# Patient Record
Sex: Male | Born: 1960 | Race: Black or African American | Hispanic: No | Marital: Single | Smoking: Former smoker
Health system: Southern US, Community
[De-identification: ages and names within clinical notes are randomized; demographics above are authoritative.]

---

## 2017-10-06 ENCOUNTER — Inpatient Hospital Stay (HOSPITAL_COMMUNITY)
Admission: EM | Admit: 2017-10-06 | Discharge: 2017-10-09 | DRG: 071 | Disposition: A | Discharge status: Home / Self Care | Payer: Medicaid Other | Attending: Critical Care Medicine | Admitting: Critical Care Medicine

## 2017-10-06 ENCOUNTER — Encounter (HOSPITAL_COMMUNITY): Payer: Self-pay | Admitting: Oncology

## 2017-10-06 ENCOUNTER — Emergency Department (HOSPITAL_COMMUNITY): Payer: Medicaid Other

## 2017-10-06 DIAGNOSIS — R0689 Other abnormalities of breathing: Secondary | ICD-10-CM | POA: Diagnosis present

## 2017-10-06 DIAGNOSIS — E669 Obesity, unspecified: Secondary | ICD-10-CM | POA: Diagnosis present

## 2017-10-06 DIAGNOSIS — M109 Gout, unspecified: Secondary | ICD-10-CM | POA: Diagnosis present

## 2017-10-06 DIAGNOSIS — R4701 Aphasia: Secondary | ICD-10-CM | POA: Diagnosis not present

## 2017-10-06 DIAGNOSIS — I6330 Cerebral infarction due to thrombosis of unspecified cerebral artery: Secondary | ICD-10-CM

## 2017-10-06 DIAGNOSIS — I6522 Occlusion and stenosis of left carotid artery: Secondary | ICD-10-CM | POA: Diagnosis present

## 2017-10-06 DIAGNOSIS — Z6841 Body Mass Index (BMI) 40.0 and over, adult: Secondary | ICD-10-CM

## 2017-10-06 DIAGNOSIS — G9340 Encephalopathy, unspecified: Secondary | ICD-10-CM | POA: Diagnosis present

## 2017-10-06 DIAGNOSIS — I6340 Cerebral infarction due to embolism of unspecified cerebral artery: Secondary | ICD-10-CM

## 2017-10-06 DIAGNOSIS — I10 Essential (primary) hypertension: Secondary | ICD-10-CM | POA: Diagnosis present

## 2017-10-06 DIAGNOSIS — G9341 Metabolic encephalopathy: Principal | ICD-10-CM | POA: Diagnosis present

## 2017-10-06 DIAGNOSIS — E860 Dehydration: Secondary | ICD-10-CM | POA: Diagnosis present

## 2017-10-06 LAB — COMPREHENSIVE METABOLIC PANEL
ALBUMIN: 4.3 g/dL (ref 3.5–5.0)
ALT: 91 U/L — ABNORMAL HIGH (ref 17–63)
ANION GAP: 11 (ref 5–15)
AST: 75 U/L — ABNORMAL HIGH (ref 15–41)
Alkaline Phosphatase: 60 U/L (ref 38–126)
BILIRUBIN TOTAL: 1.3 mg/dL — AB (ref 0.3–1.2)
BUN: 11 mg/dL (ref 6–20)
CO2: 24 mmol/L (ref 22–32)
Calcium: 9.8 mg/dL (ref 8.9–10.3)
Chloride: 100 mmol/L — ABNORMAL LOW (ref 101–111)
Creatinine, Ser: 0.93 mg/dL (ref 0.61–1.24)
GFR calc Af Amer: >60 mL/min (ref >60)
GFR calc non Af Amer: >60 mL/min (ref >60)
GLUCOSE: 121 mg/dL — AB (ref 65–99)
POTASSIUM: 4.1 mmol/L (ref 3.5–5.1)
SODIUM: 135 mmol/L (ref 135–145)
Total Protein: 8.5 g/dL — ABNORMAL HIGH (ref 6.5–8.1)

## 2017-10-06 LAB — CBC WITH DIFFERENTIAL/PLATELET
BASOS ABS: 0 10*3/uL (ref 0.0–0.1)
Basophils Relative: 0 %
EOS ABS: 0 10*3/uL (ref 0.0–0.7)
Eosinophils Relative: 0 %
HCT: 41.4 % (ref 39.0–52.0)
HEMOGLOBIN: 14.1 g/dL (ref 13.0–17.0)
LYMPHS ABS: 0.5 10*3/uL — AB (ref 0.7–4.0)
Lymphocytes Relative: 12 %
MCH: 34.8 pg — AB (ref 26.0–34.0)
MCHC: 34.1 g/dL (ref 30.0–36.0)
MCV: 102.2 fL — ABNORMAL HIGH (ref 78.0–100.0)
Monocytes Absolute: 0.2 10*3/uL (ref 0.1–1.0)
Monocytes Relative: 5 %
NEUTROS PCT: 83 %
Neutro Abs: 3.6 10*3/uL (ref 1.7–7.7)
Platelets: 198 10*3/uL (ref 150–400)
RBC: 4.05 MIL/uL — AB (ref 4.22–5.81)
RDW: 12.3 % (ref 11.5–15.5)
WBC: 4.3 10*3/uL (ref 4.0–10.5)

## 2017-10-06 LAB — ETHANOL: Alcohol, Ethyl (B): <10 mg/dL (ref <10)

## 2017-10-06 LAB — LIPASE, BLOOD: LIPASE: 37 U/L (ref 11–51)

## 2017-10-06 MED ORDER — SODIUM CHLORIDE 0.9 % IV SOLN
100.0000 mL/h | INTRAVENOUS | Status: DC
  Administered 2017-10-07 – 2017-10-09 (×6): 100 mL/h via INTRAVENOUS

## 2017-10-06 MED ORDER — ZIPRASIDONE MESYLATE 20 MG IM SOLR
20.0000 mg | Freq: Once | INTRAMUSCULAR | Status: DC
  Filled 2017-10-06: qty 20

## 2017-10-06 MED ORDER — LORAZEPAM 2 MG/ML IJ SOLN
2.0000 mg | INTRAMUSCULAR | Status: AC
  Administered 2017-10-07: 2 mg via INTRAVENOUS
  Filled 2017-10-06: qty 1

## 2017-10-06 MED ORDER — IOPAMIDOL (ISOVUE-370) INJECTION 76%
100.0000 mL | Freq: Once | INTRAVENOUS | Status: AC | PRN
  Administered 2017-10-07: 100 mL via INTRAVENOUS

## 2017-10-06 MED ORDER — SODIUM CHLORIDE 0.9 % IV BOLUS
1000.0000 mL | Freq: Once | INTRAVENOUS | Status: AC
  Administered 2017-10-06: 1000 mL via INTRAVENOUS

## 2017-10-06 MED ORDER — LORAZEPAM 2 MG/ML IJ SOLN
INTRAMUSCULAR | Status: AC
Start: 2017-10-06 — End: 2017-10-06
  Administered 2017-10-06: 2 mg
  Filled 2017-10-06: qty 1

## 2017-10-06 MED ORDER — SODIUM CHLORIDE 0.9 % IV BOLUS
500.0000 mL | Freq: Once | INTRAVENOUS | Status: AC
  Administered 2017-10-07: 500 mL via INTRAVENOUS

## 2017-10-06 MED ORDER — HALOPERIDOL LACTATE 5 MG/ML IJ SOLN
INTRAMUSCULAR | Status: AC
  Filled 2017-10-06: qty 1

## 2017-10-06 NOTE — Consult Note (Addendum)
NEURO HOSPITALIST CONSULT NOTE   Requestig physician: Dr. Jeraldine Loots  Reason for Consult: Aphasia  History obtained from:  Patient and EMS  HPI:                                                                                                                                          Phillip Rivas is an 57 y.o. male who was brought in by EMS from the [facility] strip, where he was noted to become acutely altered with stumbling and vomiting. He had been in the sun all day, so sunstroke was a consideration. He was not answering questions appropriately for EMS and was also dysarthric. Stroke was suspected so he was given 324 mg ASA en route. He denied chest pain. Skin was somewhat warm to touch on arrival. There is no information on file for this patient in Epic and he is unable to relate his PMHx.   No past medical history on file.  No family history on file.            Social History:  reports that he drinks alcohol. His tobacco and drug histories are not on file.  Allergies not on file  MEDICATIONS:                                                                                                                     Unknown  ROS:                                                                                                                                       Unable to obtain due to dysphasia/confusion.  Blood pressure (!) 144/90, pulse 82, temperature 99 F (37.2 C),  temperature source Oral, resp. rate (!) 25, SpO2 93 %.   General Examination:                                                                                                      HEENT-  /AT   Lungs- Respirations unlabored Abdomen- Obese Extremities- Warm and well perfused   Neurological Examination Mental Status: Alert. Unable to provide answers to orientation questions. No dysarthria. Follows some commands but does not comprehend most and requires pantomiming to follow. Unable to provide  coherent answers to questions. Speech output with waxing and waning fluency alternating with word salad. Cranial Nerves: II: Blinks to threat bilaterally. PERRL.  III,IV, VI: Ptosis not present. EOMI without nystagmus. No forced gaze deviation.  V,VII: Smile symmetric, reacts to tactile stimulation bilaterally VIII: hearing intact to voice IX,X: No hoarseness or hypophonia XI: Head at midline XII: Does not protrude tongue to command Motor: Right : Upper extremity   5/5    Left:     Upper extremity   5/5  Lower extremity   5/5     Lower extremity   5/5 Strength measured both by following of commands and resistance when patient spontaneously moves limbs. No twitching, jerking or other adventitious movements noted.  Sensory: Moves each limb in response to "move what I am touching"  Deep Tendon Reflexes: 1+ and symmetric throughout, except for 0 achilles reflexes Plantars: Right: downgoing  Left: downgoing Cerebellar: Slow FNF bilaterally without ataxia Gait: Deferred due to falls risk concerns   Lab Results: Basic Metabolic Panel: Recent Labs  Lab 10/06/17 1945  NA 135  K 4.1  CL 100*  CO2 24  GLUCOSE 121*  BUN 11  CREATININE 0.93  CALCIUM 9.8    CBC: Recent Labs  Lab 10/06/17 1945  WBC 4.3  NEUTROABS 3.6  HGB 14.1  HCT 41.4  MCV 102.2*  PLT 198    Cardiac Enzymes: No results for input(s): CKTOTAL, CKMB, CKMBINDEX, TROPONINI in the last 168 hours.  Lipid Panel: No results for input(s): CHOL, TRIG, HDL, CHOLHDL, VLDL, LDLCALC in the last 168 hours.  Imaging: No results found.  Assessment: 57 year old male with AMS/dysphasia 1. Exam with fluent speech alternating with word salad and comprehension deficit. No focal weakness, facial droop or ataxia noted. DDx includes left perisylvian stroke/TIA versus heat stroke versus toxic/metabolic encephalopathy.  2. STAT CT head without hemorrhage 3. STAT CTA/CTP pending due to severe agitation 4. Unknown  PMHx  Recommendations: 1. Obtain CTA/CTP when agitation resolves with medication. Has received Ativan, Haldol and Geodon. Continues to be severely agitated. Continue Code Stroke status.  2. Frequent neuro checks.  3. Toxic metabolic work up.   50 minutes spent in the emergent neurological evaluation and management of this critically ill patient.  Addendum, CTA/CTP completed: 1. Negative CTA for emergent large vessel occlusion. No acute infarct by CT perfusion. 2. 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 Left MCA branches are attenuated as compared  to the contralateral right. 3. Delayed perfusion/perfusion mismatch within the left cerebral hemisphere, overall watershed in distribution, likely secondary to the above-mentioned stenoses and atheromatous disease. 4. No other high-grade or correctable stenosis identified within the major arterial vasculature of the head and neck.  Additional recommendations: -Load ASA 650 mg x 1 now via NGT or 600 mg rectally -Full stroke work up  Electronically signed: Dr. Caryl Pina 10/06/2017, 11:15 PM

## 2017-10-06 NOTE — ED Triage Notes (Signed)
Pt bib [facility] from [facility] strip. Pt had been in the sun all day. Per EMS, bystanders saw pt stumbling(pt does use a cane to ambulate) as well as vomiting.  Pt not answering questions appropriately for EMS.  Pt given 324 mg ASA en route as pt stated he had indigestion but could not elaborate.  Pt denies CP at this time.

## 2017-10-06 NOTE — ED Notes (Signed)
Patient transported to CT 

## 2017-10-06 NOTE — ED Provider Notes (Signed)
[facility] EMERGENCY DEPARTMENT Provider Note   CSN: [id number] Arrival date & time: 10/06/17  1935     History   Chief Complaint Chief Complaint  Patient presents with  . Altered Mental Status    HPI Phillip Rivas is a 57 y.o. male.  HPI Patient presents with confusion. Onset seems to have been earlier this afternoon, and the patient does recall being [place], but patient is confused, cannot provide details of his condition, or his name. However, the patient is oriented to place, roughly. Patient denies pain, states that he may have had a headache, and was nauseous, vomiting, almost passed out prior to EMS arrival. He does acknowledge drinking alcohol earlier today. He states that he is generally well, and was well prior to the episode. No past medical history on file. Patient unable to provide any details of history     Home Medications    Patient denies medications  Family History Patient cannot provide details of family history Social History Social History   Tobacco Use  . Smoking status: Unknown If Ever Smoked  Substance Use Topics  . Alcohol use: Yes  . Drug use: Not on file     Allergies   Patient has no allergy information on record.   Review of Systems Review of Systems  Constitutional:       Per HPI, otherwise negative  HENT:       Per HPI, otherwise negative  Respiratory:       Per HPI, otherwise negative  Cardiovascular:       Per HPI, otherwise negative  Gastrointestinal: Positive for nausea and vomiting. Negative for abdominal pain.  Endocrine:       Negative aside from HPI  Genitourinary:       Neg aside from HPI   Musculoskeletal:       Per HPI, otherwise negative  Skin: Negative.   Neurological: Positive for headaches. Negative for syncope.  Psychiatric/Behavioral: Positive for confusion.     Physical Exam Updated Vital Signs BP (!) 155/100   Pulse 82   Temp 99 F (37.2 C) (Oral)   Resp 16    SpO2 99%   Physical Exam  Constitutional: He appears well-developed. No distress.  HENT:  Head: Normocephalic and atraumatic.  Eyes: Conjunctivae and EOM are normal.  Cardiovascular: Normal rate and regular rhythm.  Pulmonary/Chest: Effort normal. No stridor. No respiratory distress.  Abdominal: He exhibits no distension. There is no tenderness.  Musculoskeletal: He exhibits no edema.  Neurological: He is alert. He displays no atrophy and no tremor. He exhibits normal muscle tone. He displays no seizure activity.  Patient moves all extremity spontaneously, has equal strength in upper and lower extremities, has no tremor.  Skin: Skin is warm and dry.  Psychiatric: He has a normal mood and affect.  Slightly slowed, but interactive, awake and alert, confused.  Nursing note and vitals reviewed.    ED Treatments / Results  Labs (all labs ordered are listed, but only abnormal results are displayed) Labs Reviewed  COMPREHENSIVE METABOLIC PANEL - Abnormal; Notable for the following components:      Result Value   Chloride 100 (*)    Glucose, Bld 121 (*)    Total Protein 8.5 (*)    AST 75 (*)    ALT 91 (*)    Total Bilirubin 1.3 (*)    All other components within normal limits  CBC WITH DIFFERENTIAL/PLATELET - Abnormal; Notable for the following components:  RBC 4.05 (*)    MCV 102.2 (*)    MCH 34.8 (*)    Lymphs Abs 0.5 (*)    All other components within normal limits  ETHANOL  LIPASE, BLOOD  RAPID URINE DRUG SCREEN, HOSP PERFORMED  URINALYSIS, ROUTINE W REFLEX MICROSCOPIC  AMMONIA    EKG EKG Interpretation  Date/Time:  Saturday October 06 2017 21:17:51 EDT Ventricular Rate:  84 PR Interval:    QRS Duration: 99 QT Interval:  463 QTC Calculation: 548 R Axis:   13 Text Interpretation:  Sinus rhythm Ventricular premature complex Probable left atrial enlargement Left ventricular hypertrophy ST-t wave abnormality Baseline wander Abnormal ekg Confirmed by Gerhard Munch  [phone]) on 10/06/2017 10:46:29 PM   Radiology No results found.  Procedures Procedures (including critical care time)  Medications Ordered in ED Medications  sodium chloride 0.9 % bolus 500 mL (has no administration in time range)    Followed by  0.9 %  sodium chloride infusion (has no administration in time range)  sodium chloride 0.9 % bolus 1,000 mL (0 mLs Intravenous Stopped 10/06/17 2115)     Initial Impression / Assessment and Plan / ED Course  I have reviewed the triage vital signs and the nursing notes.  Pertinent labs & imaging results that were available during my care of the patient were reviewed by me and considered in my medical decision making (see chart for details).    10:47 PM On repeat exam the patient is making less sense verbally and then he was on arrival. Initial labs notable for minor hepatic dysfunction. Patient seemingly denies any chest or abdominal pain, but does have headache, possibly. He is following commands inconsistently, continues to have no focal asymmetry of motion, but has more unreliable neurologic exam, and with his worsening dysarthria, after discussion with our neurologist, the patient was designated as a code stroke.  12:08 AM In the CT scanner the patient became acutely agitated after being strapped down for perfusion studies. He required additional sedation with Ativan, Haldol, but was not tolerant of the study.  12:20 AM Patient intolerant of final CT scans, but initial CT stroke protocol did not demonstrate obvious stroke. Patient is now coming after multiple doses of Ativan. Patient really acknowledged some alcohol intake, but alcohol level here is 0. Some suspicion for withdrawal, though the patient's lack of tachycardia, and other obvious stigmata of withdrawal is not consistent with this. Given the persistent  dysarthria, delirium, he will require admission for further evaluation and management.   Final Clinical Impressions(s) /  ED Diagnoses  Delirium Dysarthria   Gerhard Munch, MD 10/07/17 0021

## 2017-10-07 ENCOUNTER — Inpatient Hospital Stay (HOSPITAL_COMMUNITY): Payer: Medicaid Other

## 2017-10-07 ENCOUNTER — Observation Stay (HOSPITAL_COMMUNITY): Payer: Medicaid Other

## 2017-10-07 ENCOUNTER — Other Ambulatory Visit: Payer: Self-pay

## 2017-10-07 DIAGNOSIS — R451 Restlessness and agitation: Secondary | ICD-10-CM | POA: Diagnosis not present

## 2017-10-07 DIAGNOSIS — I6522 Occlusion and stenosis of left carotid artery: Secondary | ICD-10-CM | POA: Diagnosis present

## 2017-10-07 DIAGNOSIS — G9340 Encephalopathy, unspecified: Secondary | ICD-10-CM | POA: Diagnosis present

## 2017-10-07 DIAGNOSIS — G9341 Metabolic encephalopathy: Secondary | ICD-10-CM | POA: Diagnosis present

## 2017-10-07 DIAGNOSIS — E669 Obesity, unspecified: Secondary | ICD-10-CM | POA: Diagnosis present

## 2017-10-07 DIAGNOSIS — I10 Essential (primary) hypertension: Secondary | ICD-10-CM | POA: Diagnosis present

## 2017-10-07 DIAGNOSIS — I6340 Cerebral infarction due to embolism of unspecified cerebral artery: Secondary | ICD-10-CM

## 2017-10-07 DIAGNOSIS — I6330 Cerebral infarction due to thrombosis of unspecified cerebral artery: Secondary | ICD-10-CM

## 2017-10-07 DIAGNOSIS — R0689 Other abnormalities of breathing: Secondary | ICD-10-CM | POA: Diagnosis present

## 2017-10-07 DIAGNOSIS — Z6841 Body Mass Index (BMI) 40.0 and over, adult: Secondary | ICD-10-CM | POA: Diagnosis not present

## 2017-10-07 DIAGNOSIS — J9600 Acute respiratory failure, unspecified whether with hypoxia or hypercapnia: Secondary | ICD-10-CM | POA: Diagnosis not present

## 2017-10-07 DIAGNOSIS — E860 Dehydration: Secondary | ICD-10-CM | POA: Diagnosis present

## 2017-10-07 DIAGNOSIS — M109 Gout, unspecified: Secondary | ICD-10-CM | POA: Diagnosis present

## 2017-10-07 LAB — COMPREHENSIVE METABOLIC PANEL
ALT: 74 U/L — ABNORMAL HIGH (ref 17–63)
ANION GAP: 9 (ref 5–15)
AST: 52 U/L — ABNORMAL HIGH (ref 15–41)
Albumin: 3.7 g/dL (ref 3.5–5.0)
Alkaline Phosphatase: 51 U/L (ref 38–126)
BUN: 10 mg/dL (ref 6–20)
CHLORIDE: 101 mmol/L (ref 101–111)
CO2: 25 mmol/L (ref 22–32)
CREATININE: 0.95 mg/dL (ref 0.61–1.24)
Calcium: 9.2 mg/dL (ref 8.9–10.3)
GFR calc Af Amer: >60 mL/min (ref >60)
Glucose, Bld: 128 mg/dL — ABNORMAL HIGH (ref 65–99)
POTASSIUM: 4.1 mmol/L (ref 3.5–5.1)
SODIUM: 135 mmol/L (ref 135–145)
Total Bilirubin: 1.2 mg/dL (ref 0.3–1.2)
Total Protein: 7.3 g/dL (ref 6.5–8.1)

## 2017-10-07 LAB — LIPID PANEL
Cholesterol: 184 mg/dL (ref 0–200)
HDL: 125 mg/dL (ref >40)
LDL CALC: 52 mg/dL (ref 0–99)
TRIGLYCERIDES: 34 mg/dL (ref <150)
Total CHOL/HDL Ratio: 1.5 RATIO
VLDL: 7 mg/dL (ref 0–40)

## 2017-10-07 LAB — I-STAT ARTERIAL BLOOD GAS, ED
ACID-BASE DEFICIT: 3 mmol/L — AB (ref 0.0–2.0)
BICARBONATE: 22.8 mmol/L (ref 20.0–28.0)
O2 Saturation: 100 %
PO2 ART: 303 mmHg — AB (ref 83.0–108.0)
TCO2: 24 mmol/L (ref 22–32)
pCO2 arterial: 42.6 mmHg (ref 32.0–48.0)
pH, Arterial: 7.337 — ABNORMAL LOW (ref 7.350–7.450)

## 2017-10-07 LAB — RAPID URINE DRUG SCREEN, HOSP PERFORMED
Amphetamines: NOT DETECTED
Barbiturates: NOT DETECTED
Benzodiazepines: NOT DETECTED
COCAINE: NOT DETECTED
Opiates: NOT DETECTED
Tetrahydrocannabinol: NOT DETECTED

## 2017-10-07 LAB — POCT I-STAT 3, ART BLOOD GAS (G3+)
Acid-base deficit: 3 mmol/L — ABNORMAL HIGH (ref 0.0–2.0)
BICARBONATE: 21.7 mmol/L (ref 20.0–28.0)
O2 SAT: 98 %
PCO2 ART: 35.8 mmHg (ref 32.0–48.0)
PH ART: 7.389 (ref 7.350–7.450)
PO2 ART: 107 mmHg (ref 83.0–108.0)
TCO2: 23 mmol/L (ref 22–32)

## 2017-10-07 LAB — CBC WITH DIFFERENTIAL/PLATELET
Basophils Absolute: 0 10*3/uL (ref 0.0–0.1)
Basophils Relative: 0 %
EOS ABS: 0 10*3/uL (ref 0.0–0.7)
EOS PCT: 0 %
HCT: 38.6 % — ABNORMAL LOW (ref 39.0–52.0)
Hemoglobin: 12.8 g/dL — ABNORMAL LOW (ref 13.0–17.0)
LYMPHS ABS: 0.5 10*3/uL — AB (ref 0.7–4.0)
LYMPHS PCT: 12 %
MCH: 34.1 pg — AB (ref 26.0–34.0)
MCHC: 33.2 g/dL (ref 30.0–36.0)
MCV: 102.9 fL — AB (ref 78.0–100.0)
MONO ABS: 0.5 10*3/uL (ref 0.1–1.0)
Monocytes Relative: 11 %
Neutro Abs: 3.5 10*3/uL (ref 1.7–7.7)
Neutrophils Relative %: 77 %
PLATELETS: 171 10*3/uL (ref 150–400)
RBC: 3.75 MIL/uL — ABNORMAL LOW (ref 4.22–5.81)
RDW: 12.4 % (ref 11.5–15.5)
WBC: 4.5 10*3/uL (ref 4.0–10.5)

## 2017-10-07 LAB — GLUCOSE, CAPILLARY
GLUCOSE-CAPILLARY: 82 mg/dL (ref 65–99)
Glucose-Capillary: 104 mg/dL — ABNORMAL HIGH (ref 65–99)
Glucose-Capillary: 111 mg/dL — ABNORMAL HIGH (ref 65–99)
Glucose-Capillary: 119 mg/dL — ABNORMAL HIGH (ref 65–99)
Glucose-Capillary: 123 mg/dL — ABNORMAL HIGH (ref 65–99)

## 2017-10-07 LAB — TROPONIN I: Troponin I: <0.03 ng/mL (ref <0.03)

## 2017-10-07 LAB — URINALYSIS, ROUTINE W REFLEX MICROSCOPIC
BILIRUBIN URINE: NEGATIVE
Bacteria, UA: NONE SEEN
GLUCOSE, UA: NEGATIVE mg/dL
KETONES UR: 20 mg/dL — AB
LEUKOCYTES UA: NEGATIVE
NITRITE: NEGATIVE
PH: 5 (ref 5.0–8.0)
Protein, ur: 30 mg/dL — AB
SPECIFIC GRAVITY, URINE: 1.024 (ref 1.005–1.030)

## 2017-10-07 LAB — MRSA PCR SCREENING: MRSA by PCR: NEGATIVE

## 2017-10-07 LAB — PROCALCITONIN: PROCALCITONIN: 0.1 ng/mL

## 2017-10-07 LAB — TSH: TSH: 1.468 u[IU]/mL (ref 0.350–4.500)

## 2017-10-07 LAB — AMMONIA: Ammonia: 20 umol/L (ref 9–35)

## 2017-10-07 LAB — SALICYLATE LEVEL: Salicylate Lvl: <7 mg/dL (ref 2.8–30.0)

## 2017-10-07 LAB — GAMMA GT: GGT: 77 U/L — AB (ref 7–50)

## 2017-10-07 LAB — PHOSPHORUS: PHOSPHORUS: 2.8 mg/dL (ref 2.5–4.6)

## 2017-10-07 LAB — MAGNESIUM: MAGNESIUM: 1.8 mg/dL (ref 1.7–2.4)

## 2017-10-07 LAB — BRAIN NATRIURETIC PEPTIDE: B Natriuretic Peptide: 259.3 pg/mL — ABNORMAL HIGH (ref 0.0–100.0)

## 2017-10-07 LAB — HIV ANTIBODY (ROUTINE TESTING W REFLEX): HIV SCREEN 4TH GENERATION: NONREACTIVE

## 2017-10-07 LAB — HEMOGLOBIN A1C
HEMOGLOBIN A1C: 4.2 % — AB (ref 4.8–5.6)
Mean Plasma Glucose: 73.84 mg/dL

## 2017-10-07 LAB — ACETAMINOPHEN LEVEL: Acetaminophen (Tylenol), Serum: <10 ug/mL — ABNORMAL LOW (ref 10–30)

## 2017-10-07 LAB — LACTIC ACID, PLASMA: Lactic Acid, Venous: 0.8 mmol/L (ref 0.5–1.9)

## 2017-10-07 LAB — CK: Total CK: 296 U/L (ref 49–397)

## 2017-10-07 LAB — TRIGLYCERIDES: Triglycerides: 52 mg/dL (ref <150)

## 2017-10-07 MED ORDER — MIDAZOLAM HCL 2 MG/2ML IJ SOLN
INTRAMUSCULAR | Status: AC
  Filled 2017-10-07: qty 2

## 2017-10-07 MED ORDER — LORAZEPAM 2 MG/ML IJ SOLN: 0.0000 mg | Freq: Four times a day (QID) | INTRAMUSCULAR | Status: DC

## 2017-10-07 MED ORDER — PROPOFOL 1000 MG/100ML IV EMUL
INTRAVENOUS | Status: AC
  Filled 2017-10-07: qty 100

## 2017-10-07 MED ORDER — ORAL CARE MOUTH RINSE: 15.0000 mL | OROMUCOSAL | Status: DC

## 2017-10-07 MED ORDER — MIDAZOLAM HCL 2 MG/2ML IJ SOLN
5.0000 mg | Freq: Once | INTRAMUSCULAR | Status: AC
  Administered 2017-10-07: 5 mg via INTRAVENOUS

## 2017-10-07 MED ORDER — FENTANYL 2500MCG IN NS 250ML (10MCG/ML) PREMIX INFUSION
0.0000 ug/h | INTRAVENOUS | Status: DC
  Administered 2017-10-07: 50 ug/h via INTRAVENOUS
  Filled 2017-10-07: qty 250

## 2017-10-07 MED ORDER — CHLORHEXIDINE GLUCONATE 0.12% ORAL RINSE (MEDLINE KIT)
15.0000 mL | Freq: Two times a day (BID) | OROMUCOSAL | Status: DC
  Administered 2017-10-07: 15 mL via OROMUCOSAL

## 2017-10-07 MED ORDER — PANTOPRAZOLE SODIUM 40 MG PO PACK
40.0000 mg | PACK | Freq: Every day | ORAL | Status: DC
  Filled 2017-10-07: qty 20

## 2017-10-07 MED ORDER — ETOMIDATE 2 MG/ML IV SOLN
INTRAVENOUS | Status: DC | PRN
Start: 2017-10-07 — End: 2017-10-07
  Administered 2017-10-07: 20 mg via INTRAVENOUS

## 2017-10-07 MED ORDER — MAGNESIUM SULFATE 2 GM/50ML IV SOLN
2.0000 g | Freq: Once | INTRAVENOUS | Status: AC
  Administered 2017-10-07: 2 g via INTRAVENOUS
  Filled 2017-10-07: qty 50

## 2017-10-07 MED ORDER — LORAZEPAM 2 MG/ML IJ SOLN
INTRAMUSCULAR | Status: AC
  Filled 2017-10-07: qty 1

## 2017-10-07 MED ORDER — ASPIRIN 300 MG RE SUPP
600.0000 mg | Freq: Once | RECTAL | Status: AC
  Administered 2017-10-07: 600 mg via RECTAL
  Filled 2017-10-07: qty 2

## 2017-10-07 MED ORDER — SUCCINYLCHOLINE CHLORIDE 20 MG/ML IJ SOLN
INTRAMUSCULAR | Status: DC | PRN
  Administered 2017-10-07: 125 mg via INTRAVENOUS

## 2017-10-07 MED ORDER — VITAMIN B-1 100 MG PO TABS: 100.0000 mg | ORAL_TABLET | Freq: Every day | ORAL | Status: DC

## 2017-10-07 MED ORDER — STROKE: EARLY STAGES OF RECOVERY BOOK
Freq: Once | Status: DC
  Filled 2017-10-07: qty 1

## 2017-10-07 MED ORDER — SODIUM CHLORIDE 0.9 % IV SOLN: 250.0000 mL | INTRAVENOUS | Status: DC | PRN

## 2017-10-07 MED ORDER — LORAZEPAM 2 MG/ML IJ SOLN: 0.0000 mg | Freq: Two times a day (BID) | INTRAMUSCULAR | Status: DC

## 2017-10-07 MED ORDER — PROPOFOL 1000 MG/100ML IV EMUL
5.0000 ug/kg/min | Freq: Once | INTRAVENOUS | Status: DC
  Administered 2017-10-07: 20 ug/kg/min via INTRAVENOUS

## 2017-10-07 MED ORDER — LORAZEPAM 1 MG PO TABS: 0.0000 mg | ORAL_TABLET | Freq: Two times a day (BID) | ORAL | Status: DC

## 2017-10-07 MED ORDER — FENTANYL CITRATE (PF) 100 MCG/2ML IJ SOLN
INTRAMUSCULAR | Status: AC
  Filled 2017-10-07: qty 2

## 2017-10-07 MED ORDER — MIDAZOLAM HCL 2 MG/2ML IJ SOLN
INTRAMUSCULAR | Status: AC
  Filled 2017-10-07: qty 6

## 2017-10-07 MED ORDER — SENNOSIDES-DOCUSATE SODIUM 8.6-50 MG PO TABS: 1.0000 | ORAL_TABLET | Freq: Every evening | ORAL | Status: DC | PRN

## 2017-10-07 MED ORDER — MIDAZOLAM HCL 2 MG/2ML IJ SOLN
INTRAMUSCULAR | Status: AC
  Filled 2017-10-07: qty 4

## 2017-10-07 MED ORDER — PROPOFOL 1000 MG/100ML IV EMUL
0.0000 ug/kg/min | INTRAVENOUS | Status: DC
  Administered 2017-10-07: 50 ug/kg/min via INTRAVENOUS
  Filled 2017-10-07: qty 100

## 2017-10-07 MED ORDER — ASPIRIN EC 81 MG PO TBEC
81.0000 mg | DELAYED_RELEASE_TABLET | Freq: Every day | ORAL | Status: DC
  Administered 2017-10-08 – 2017-10-09 (×2): 81 mg via ORAL
  Filled 2017-10-07 (×2): qty 1

## 2017-10-07 MED ORDER — CHLORHEXIDINE GLUCONATE 0.12 % MT SOLN
15.0000 mL | Freq: Two times a day (BID) | OROMUCOSAL | Status: DC
  Administered 2017-10-07 – 2017-10-09 (×4): 15 mL via OROMUCOSAL
  Filled 2017-10-07: qty 15

## 2017-10-07 MED ORDER — THIAMINE HCL 100 MG/ML IJ SOLN
100.0000 mg | Freq: Every day | INTRAMUSCULAR | Status: DC
  Administered 2017-10-07 – 2017-10-09 (×3): 100 mg via INTRAVENOUS
  Filled 2017-10-07 (×3): qty 2

## 2017-10-07 MED ORDER — ATORVASTATIN CALCIUM 80 MG PO TABS
80.0000 mg | ORAL_TABLET | Freq: Every day | ORAL | Status: DC
  Administered 2017-10-08: 80 mg
  Filled 2017-10-07: qty 1

## 2017-10-07 MED ORDER — ENOXAPARIN SODIUM 40 MG/0.4ML ~~LOC~~ SOLN
40.0000 mg | Freq: Every day | SUBCUTANEOUS | Status: DC
  Administered 2017-10-07 – 2017-10-09 (×3): 40 mg via SUBCUTANEOUS
  Filled 2017-10-07 (×3): qty 0.4

## 2017-10-07 MED ORDER — THIAMINE HCL 100 MG/ML IJ SOLN
500.0000 mg | Freq: Every day | INTRAVENOUS | Status: DC
  Filled 2017-10-07: qty 5

## 2017-10-07 MED ORDER — LORAZEPAM 1 MG PO TABS: 0.0000 mg | ORAL_TABLET | Freq: Four times a day (QID) | ORAL | Status: DC

## 2017-10-07 MED ORDER — DEXMEDETOMIDINE HCL IN NACL 200 MCG/50ML IV SOLN
0.4000 ug/kg/h | INTRAVENOUS | Status: DC
Start: 2017-10-07 — End: 2017-10-08
  Administered 2017-10-07 (×4): 0.4 ug/kg/h via INTRAVENOUS
  Administered 2017-10-07: 0.5 ug/kg/h via INTRAVENOUS
  Administered 2017-10-08: 0.2 ug/kg/h via INTRAVENOUS
  Filled 2017-10-07 (×2): qty 50
  Filled 2017-10-07: qty 100
  Filled 2017-10-07 (×2): qty 50

## 2017-10-07 MED ORDER — THIAMINE HCL 100 MG/ML IJ SOLN: 100.0000 mg | Freq: Every day | INTRAMUSCULAR | Status: DC

## 2017-10-07 MED ORDER — ORAL CARE MOUTH RINSE
15.0000 mL | Freq: Two times a day (BID) | OROMUCOSAL | Status: DC
  Administered 2017-10-07 – 2017-10-08 (×3): 15 mL via OROMUCOSAL

## 2017-10-07 NOTE — Progress Notes (Signed)
 of fentanyl wasted in sink with Elayne Snare. RN

## 2017-10-07 NOTE — Procedures (Signed)
Extubation Procedure Note  Patient Details:   Name: Phillip Rivas DOB: 05-28-1961 MRN: [id number]   Airway Documentation:    Vent end date: 10/07/17 Vent end time: 0901   Evaluation  O2 sats: stable throughout Complications: No apparent complications Patient did tolerate procedure well. Bilateral Breath Sounds: Clear, Diminished   Yes   Pt extubated to 3L [place] per MD order. Pt stable throughout with no complications. VS within normal limits. Pt with positive cuff prior to extubation. Pt able to speak/mumble post extubation but unable/unwilling to cough. No stridor noted. RT will continue to closely monitor pt  Carolan Shiver 10/07/2017, 9:02 AM

## 2017-10-07 NOTE — Progress Notes (Signed)
PULMONARY / CRITICAL CARE MEDICINE   Name: Phillip Rivas MRN: [id number] DOB: 1960-08-29    ADMISSION DATE:  10/06/2017    CHIEF COMPLAINT: Altered mental status  HISTORY OF PRESENT ILLNESS:        This is a 57 year old who [place].  He is brought to the department emergency medicine for altered mental status where he required intubation mechanical ventilation to allow diagnostic studies.  Tox screen was negative.  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  He required 6 mg of Ativan in the wee hours of the morning to facilitate his CT scan.  When I initially examined him on propofol this morning he was unresponsive I held his propofol and he eventually began moving all 4 spontaneously but did not open eyes to noxious stimuli or voice.  He remains intubated and mechanically ventilated.  PAST MEDICAL HISTORY :  He  has no past medical history on file.  PAST SURGICAL HISTORY: He  has no past surgical history on file.  Not on File  No current facility-administered medications on file prior to encounter.    No current outpatient medications on file prior to encounter.    FAMILY HISTORY:  His has no family status information on file.    SOCIAL HISTORY: He  reports that he drinks alcohol.  REVIEW OF SYSTEMS:   Unobtainable.  His past medical history is unknown  SUBJECTIVE:  Unobtainable  VITAL SIGNS: BP 105/78   Pulse 62   Temp 99 F (37.2 C) (Oral)   Resp 20   Ht  (1.88 m)   Wt (!) 330 lb 11 oz (150 kg)   SpO2 99%   BMI 42.46 kg/m   HEMODYNAMICS:    VENTILATOR SETTINGS: Vent Mode: PRVC FiO2 (%):  [100 %] 100 % Set Rate:  [20 bmp] 20 bmp Vt Set:  [540 mL] 540 mL PEEP:  [5 cmH20] 5 cmH20 Plateau Pressure:  [25 cmH20] 25 cmH20  INTAKE / OUTPUT: I/O last 3 completed shifts: In: 1500  [IV Piggyback:1500] Out: -   PHYSICAL EXAMINATION: General: Somewhat obese middle-aged male who is orally intubated mechanically ventilated Neuro: He is not responsive to voice with his propofol held.  He does move all fours, he does not eye open.  Pupils are equal. Cardiovascular: S1 and S2 are regular without murmur rub or gallop.  He does not have dependent edema. Lungs: Respirations are unlabored, there is symmetric air movement, no wheezes Abdomen: The abdomen is soft without any organomegaly masses or tenderness   LABS:  BMET Recent Labs  Lab 10/06/17 1945 10/07/17 0202  NA 135 135  K 4.1 4.1  CL 100* 101  CO2 24 25  BUN 11 10  CREATININE 0.93 0.95  GLUCOSE 121* 128*    Electrolytes Recent Labs  Lab 10/06/17 1945 10/07/17 0202 10/07/17 0404  CALCIUM 9.8 9.2  --   MG  --   --  1.8  PHOS  --   --  2.8    CBC Recent Labs  Lab 10/06/17 1945 10/07/17 0202  WBC 4.3 4.5  HGB 14.1 12.8*  HCT 41.4 38.6*  PLT 198 171    Coag's No results for input(s): APTT, INR in the last 168 hours.  Sepsis Markers Recent Labs  Lab 10/07/17 0356  LATICACIDVEN 0.8  PROCALCITON 0.10  ABG Recent Labs  Lab 10/07/17 0350  PHART 7.337*  PCO2ART 42.6  PO2ART 303.0*    Liver Enzymes Recent Labs  Lab 10/06/17 1945 10/07/17 0202  AST 75* 52*  ALT 91* 74*  ALKPHOS 60 51  BILITOT 1.3* 1.2  ALBUMIN 4.3 3.7    Cardiac Enzymes Recent Labs  Lab 10/07/17 0404  TROPONINI <0.03    Glucose No results for input(s): GLUCAP in the last 168 hours.  Imaging Ct Angio Head W Or Wo Contrast  Result Date: 10/07/2017 CLINICAL DATA:  Initial evaluation for acute expressive aphasia. EXAM: CT ANGIOGRAPHY HEAD AND NECK CT PERFUSION BRAIN TECHNIQUE: Multidetector CT imaging of the head and neck was performed using the standard protocol during bolus administration of intravenous contrast. Multiplanar CT image reconstructions and MIPs were obtained to evaluate the vascular  anatomy. Carotid stenosis measurements (when applicable) are obtained utilizing NASCET criteria, using the distal internal carotid diameter as the denominator. Multiphase CT imaging of the brain was performed following IV bolus contrast injection. Subsequent parametric perfusion maps were calculated using RAPID software. CONTRAST:  ISOVUE-370 IOPAMIDOL (ISOVUE-370) INJECTION 76% COMPARISON:  Prior CT from 10/06/2017. FINDINGS: CTA NECK FINDINGS Aortic arch: Visualized aortic arch of normal caliber with normal 3 vessel morphology. No flow-limiting stenosis about the origin of the great vessels. Partially visualized subclavian arteries widely patent. Right carotid system: Right common and internal carotid arteries are patent without stenosis, dissection, or occlusion. No significant atheromatous narrowing about the right carotid bifurcation. Left carotid system: Left common and internal carotid arteries are patent without stenosis, dissection, or occlusion. No significant atheromatous narrowing about the left carotid bifurcation. Vertebral arteries: Both of the vertebral arteries arise from the subclavian arteries. Vertebral arteries poorly evaluated proximally due to extensive adjacent venous collateralization. Vertebral arteries are grossly patent to the skull base without obvious stenosis. Left vertebral artery slightly dominant. Skeleton: No acute osseus abnormality. No worrisome lytic or blastic osseous lesions. Mild multilevel degenerate spondylolysis noted within cervical spine. Other neck: No acute soft tissue abnormality within the neck. Endotracheal and enteric tubes in place. Upper chest: Visualized upper mediastinum within normal limits. Dependent atelectatic changes noted within the visualized lungs. Review of the MIP images confirms the above findings CTA HEAD FINDINGS Anterior circulation: Petrous, cavernous, and supraclinoid right ICA patent without flow-limiting stenosis. Right ICA terminus  widely patent. Right M1 segment well perfused without stenosis. No proximal right M2 occlusion. Distal right MCA branches well perfused. Petrous and cavernous left ICA widely patent. There is a focal moderate stenosis at the supraclinoid left ICA (series 8, image 118). Left ICA terminus patent distally. Asymmetric atheromatous irregularity throughout the left M1 segment with mild to moderate multifocal narrowing. No M1 occlusion. Additional focal severe proximal left M2 stenosis (series 10, image 99). No proximal M2 occlusion. Distal left MCA branches perfused with demonstrate asymmetric atheromatous irregularity as compared to the contralateral right MCA. Overall, left MCA branches are attenuated as compared to the contralateral right. A1 segments patent bilaterally. Patent and normal anterior communicating artery. Anterior cerebral artery is perfused to their distal aspects without high-grade stenosis. Posterior circulation: Dominant left vertebral artery patent to the vertebrobasilar junction without flow-limiting stenosis. Slightly diminutive right vertebral artery demonstrates scattered atheromatous irregularity without high-grade stenosis. Posterior inferior cerebral arteries patent bilaterally. Basilar patent to its distal aspect without stenosis. Superior cerebral arteries patent bilaterally. PCAs primarily supplied via the basilar and are patent to their distal aspects. Venous sinuses: Not well evaluated due to timing of the contrast  bolus. Anatomic variants: None significant.  No appreciable aneurysm. Delayed phase: Not performed. Review of the MIP images confirms the above findings CT Brain Perfusion Findings: CBF (<30%) Volume: 0mL Perfusion (Tmax>6.0s) volume: 58mL Mismatch Volume: 58mL Infarction Location:No frank infarct by CT perfusion. There is delayed perfusion with perfusion mismatch involving the left cerebral hemisphere, primarily involving the posterior left MCA territory and/or MCA-PCA  watershed territory, with additional perfusion abnormality within the deep white matter of the left cerebral hemisphere, left MCA watershed area. Findings suspected to be related to left supraclinoid stenosis with asymmetric atheromatous irregularity within the left MCA distribution. IMPRESSION: 1. Negative CTA for emergent large vessel occlusion. No acute infarct by CT perfusion. 2. 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 Left MCA branches are attenuated as compared to the contralateral right. 3. Delayed perfusion/perfusion mismatch within the left cerebral hemisphere, overall watershed in distribution, likely secondary to the above-mentioned stenoses and atheromatous disease. 4. No other high-grade or correctable stenosis identified within the major arterial vasculature of the head and neck. Critical Value/emergent results were called by telephone at the time of interpretation on 10/07/2017 at 5:14 am to Dr. Otelia Limes, who verbally acknowledged these results. Electronically Signed   By: Rise Mu M.D.   On: 10/07/2017 05:38   Ct Angio Neck W Or Wo Contrast  Result Date: 10/07/2017 CLINICAL DATA:  Initial evaluation for acute expressive aphasia. EXAM: CT ANGIOGRAPHY HEAD AND NECK CT PERFUSION BRAIN TECHNIQUE: Multidetector CT imaging of the head and neck was performed using the standard protocol during bolus administration of intravenous contrast. Multiplanar CT image reconstructions and MIPs were obtained to evaluate the vascular anatomy. Carotid stenosis measurements (when applicable) are obtained utilizing NASCET criteria, using the distal internal carotid diameter as the denominator. Multiphase CT imaging of the brain was performed following IV bolus contrast injection. Subsequent parametric perfusion maps were calculated using RAPID software. CONTRAST:   ISOVUE-370 IOPAMIDOL (ISOVUE-370) INJECTION 76% COMPARISON:  Prior CT from 10/06/2017. FINDINGS: CTA NECK FINDINGS Aortic arch: Visualized aortic arch of normal caliber with normal 3 vessel morphology. No flow-limiting stenosis about the origin of the great vessels. Partially visualized subclavian arteries widely patent. Right carotid system: Right common and internal carotid arteries are patent without stenosis, dissection, or occlusion. No significant atheromatous narrowing about the right carotid bifurcation. Left carotid system: Left common and internal carotid arteries are patent without stenosis, dissection, or occlusion. No significant atheromatous narrowing about the left carotid bifurcation. Vertebral arteries: Both of the vertebral arteries arise from the subclavian arteries. Vertebral arteries poorly evaluated proximally due to extensive adjacent venous collateralization. Vertebral arteries are grossly patent to the skull base without obvious stenosis. Left vertebral artery slightly dominant. Skeleton: No acute osseus abnormality. No worrisome lytic or blastic osseous lesions. Mild multilevel degenerate spondylolysis noted within cervical spine. Other neck: No acute soft tissue abnormality within the neck. Endotracheal and enteric tubes in place. Upper chest: Visualized upper mediastinum within normal limits. Dependent atelectatic changes noted within the visualized lungs. Review of the MIP images confirms the above findings CTA HEAD FINDINGS Anterior circulation: Petrous, cavernous, and supraclinoid right ICA patent without flow-limiting stenosis. Right ICA terminus widely patent. Right M1 segment well perfused without stenosis. No proximal right M2 occlusion. Distal right MCA branches well perfused. Petrous and cavernous left ICA widely patent. There is a focal moderate stenosis at the supraclinoid left ICA (series 8, image 118).  Left ICA terminus patent distally. Asymmetric atheromatous  irregularity throughout the left M1 segment with mild to moderate multifocal narrowing. No M1 occlusion. Additional focal severe proximal left M2 stenosis (series 10, image 99). No proximal M2 occlusion. Distal left MCA branches perfused with demonstrate asymmetric atheromatous irregularity as compared to the contralateral right MCA. Overall, left MCA branches are attenuated as compared to the contralateral right. A1 segments patent bilaterally. Patent and normal anterior communicating artery. Anterior cerebral artery is perfused to their distal aspects without high-grade stenosis. Posterior circulation: Dominant left vertebral artery patent to the vertebrobasilar junction without flow-limiting stenosis. Slightly diminutive right vertebral artery demonstrates scattered atheromatous irregularity without high-grade stenosis. Posterior inferior cerebral arteries patent bilaterally. Basilar patent to its distal aspect without stenosis. Superior cerebral arteries patent bilaterally. PCAs primarily supplied via the basilar and are patent to their distal aspects. Venous sinuses: Not well evaluated due to timing of the contrast bolus. Anatomic variants: None significant.  No appreciable aneurysm. Delayed phase: Not performed. Review of the MIP images confirms the above findings CT Brain Perfusion Findings: CBF (<30%) Volume: 0mL Perfusion (Tmax>6.0s) volume: 58mL Mismatch Volume: 58mL Infarction Location:No frank infarct by CT perfusion. There is delayed perfusion with perfusion mismatch involving the left cerebral hemisphere, primarily involving the posterior left MCA territory and/or MCA-PCA watershed territory, with additional perfusion abnormality within the deep white matter of the left cerebral hemisphere, left MCA watershed area. Findings suspected to be related to left supraclinoid stenosis with asymmetric atheromatous irregularity within the left MCA distribution. IMPRESSION: 1. Negative CTA for emergent large  vessel occlusion. No acute infarct by CT perfusion. 2. 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 Left MCA branches are attenuated as compared to the contralateral right. 3. Delayed perfusion/perfusion mismatch within the left cerebral hemisphere, overall watershed in distribution, likely secondary to the above-mentioned stenoses and atheromatous disease. 4. No other high-grade or correctable stenosis identified within the major arterial vasculature of the head and neck. Critical Value/emergent results were called by telephone at the time of interpretation on 10/07/2017 at 5:14 am to Dr. Otelia Limes, who verbally acknowledged these results. Electronically Signed   By: Rise Mu M.D.   On: 10/07/2017 05:38   Ct Cerebral Perfusion W Contrast  Result Date: 10/07/2017 CLINICAL DATA:  Initial evaluation for acute expressive aphasia. EXAM: CT ANGIOGRAPHY HEAD AND NECK CT PERFUSION BRAIN TECHNIQUE: Multidetector CT imaging of the head and neck was performed using the standard protocol during bolus administration of intravenous contrast. Multiplanar CT image reconstructions and MIPs were obtained to evaluate the vascular anatomy. Carotid stenosis measurements (when applicable) are obtained utilizing NASCET criteria, using the distal internal carotid diameter as the denominator. Multiphase CT imaging of the brain was performed following IV bolus contrast injection. Subsequent parametric perfusion maps were calculated using RAPID software. CONTRAST:  ISOVUE-370 IOPAMIDOL (ISOVUE-370) INJECTION 76% COMPARISON:  Prior CT from 10/06/2017. FINDINGS: CTA NECK FINDINGS Aortic arch: Visualized aortic arch of normal caliber with normal 3 vessel morphology. No flow-limiting stenosis about the origin of the great vessels. Partially visualized subclavian arteries widely patent.  Right carotid system: Right common and internal carotid arteries are patent without stenosis, dissection, or occlusion. No significant atheromatous narrowing about the right carotid bifurcation. Left carotid system: Left common and internal carotid arteries are patent without stenosis, dissection, or occlusion. No significant atheromatous narrowing about the left carotid bifurcation. Vertebral arteries: Both of the  vertebral arteries arise from the subclavian arteries. Vertebral arteries poorly evaluated proximally due to extensive adjacent venous collateralization. Vertebral arteries are grossly patent to the skull base without obvious stenosis. Left vertebral artery slightly dominant. Skeleton: No acute osseus abnormality. No worrisome lytic or blastic osseous lesions. Mild multilevel degenerate spondylolysis noted within cervical spine. Other neck: No acute soft tissue abnormality within the neck. Endotracheal and enteric tubes in place. Upper chest: Visualized upper mediastinum within normal limits. Dependent atelectatic changes noted within the visualized lungs. Review of the MIP images confirms the above findings CTA HEAD FINDINGS Anterior circulation: Petrous, cavernous, and supraclinoid right ICA patent without flow-limiting stenosis. Right ICA terminus widely patent. Right M1 segment well perfused without stenosis. No proximal right M2 occlusion. Distal right MCA branches well perfused. Petrous and cavernous left ICA widely patent. There is a focal moderate stenosis at the supraclinoid left ICA (series 8, image 118). Left ICA terminus patent distally. Asymmetric atheromatous irregularity throughout the left M1 segment with mild to moderate multifocal narrowing. No M1 occlusion. Additional focal severe proximal left M2 stenosis (series 10, image 99). No proximal M2 occlusion. Distal left MCA branches perfused with demonstrate asymmetric atheromatous irregularity as compared to the contralateral right MCA.  Overall, left MCA branches are attenuated as compared to the contralateral right. A1 segments patent bilaterally. Patent and normal anterior communicating artery. Anterior cerebral artery is perfused to their distal aspects without high-grade stenosis. Posterior circulation: Dominant left vertebral artery patent to the vertebrobasilar junction without flow-limiting stenosis. Slightly diminutive right vertebral artery demonstrates scattered atheromatous irregularity without high-grade stenosis. Posterior inferior cerebral arteries patent bilaterally. Basilar patent to its distal aspect without stenosis. Superior cerebral arteries patent bilaterally. PCAs primarily supplied via the basilar and are patent to their distal aspects. Venous sinuses: Not well evaluated due to timing of the contrast bolus. Anatomic variants: None significant.  No appreciable aneurysm. Delayed phase: Not performed. Review of the MIP images confirms the above findings CT Brain Perfusion Findings: CBF (<30%) Volume: 0mL Perfusion (Tmax>6.0s) volume: 58mL Mismatch Volume: 58mL Infarction Location:No frank infarct by CT perfusion. There is delayed perfusion with perfusion mismatch involving the left cerebral hemisphere, primarily involving the posterior left MCA territory and/or MCA-PCA watershed territory, with additional perfusion abnormality within the deep white matter of the left cerebral hemisphere, left MCA watershed area. Findings suspected to be related to left supraclinoid stenosis with asymmetric atheromatous irregularity within the left MCA distribution. IMPRESSION: 1. Negative CTA for emergent large vessel occlusion. No acute infarct by CT perfusion. 2. 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 Left MCA branches are attenuated as compared to the contralateral right. 3. Delayed  perfusion/perfusion mismatch within the left cerebral hemisphere, overall watershed in distribution, likely secondary to the above-mentioned stenoses and atheromatous disease. 4. No other high-grade or correctable stenosis identified within the major arterial vasculature of the head and neck. Critical Value/emergent results were called by telephone at the time of interpretation on 10/07/2017 at 5:14 am to Dr. Otelia Limes, who verbally acknowledged these results. Electronically Signed   By: Rise Mu M.D.   On: 10/07/2017 05:38   Dg Chest Portable 1 View  Result Date: 10/07/2017 CLINICAL DATA:  Intubation.  OG insertion. EXAM: PORTABLE CHEST 1 VIEW COMPARISON:  None. FINDINGS: Endotracheal tube placed with tip measuring 4.1 cm above the carina. Enteric tube was placed. Tip is off the field of view but below  the left hemidiaphragm. Proximal side hole is at or just below the EG junction region. Shallow inspiration. Cardiac enlargement. Probable vascular congestion. No edema or consolidation. Atelectasis in the lung bases. No blunting of costophrenic angles. No pneumothorax. IMPRESSION: Appliances appear in satisfactory position. Shallow inspiration with atelectasis in the lung bases. Cardiac enlargement with vascular congestion. No edema or consolidation. Electronically Signed   By: Burman Nieves M.D.   On: 10/07/2017 03:38   Ct Head Code Stroke Wo Contrast`  Result Date: 10/06/2017 CLINICAL DATA:  Code stroke. Initial evaluation for acute altered mental status. EXAM: CT HEAD WITHOUT CONTRAST TECHNIQUE: Contiguous axial images were obtained from the base of the skull through the vertex without intravenous contrast. COMPARISON:  None available. FINDINGS: Brain: Generalized age-related cerebral atrophy with mild chronic small vessel ischemic disease. Small remote lacunar infarct noted within the left caudate. No acute intracranial hemorrhage. No definite acute large vessel territory infarct. No mass  lesion, midline shift or mass effect. No hydrocephalus. No extra-axial fluid collection. Vascular: No asymmetric hyperdense vessel. Scattered vascular calcifications noted within the carotid siphons. Skull: Scalp soft tissues and calvarium within normal limits. Sinuses/Orbits: Globes and orbital soft tissues demonstrate no acute abnormality. Remote posttraumatic defect noted at the left lamina for pre shift. Changes related chronic maxillary sinusitis noted. Retention cysts noted within left sphenoid sinus. No mastoid effusion. Other: None. ASPECTS [facility] Stroke Program Early CT Score) - Ganglionic level infarction (caudate, lentiform nuclei, internal capsule, insula, M1-M3 cortex): 7 - Supraganglionic infarction (M4-M6 cortex): 3 Total score (0-10 with 10 being normal): 10 IMPRESSION: 1. No acute intracranial infarct or other abnormality identified. 2. ASPECTS is 10. 3. Atrophy with mild chronic small vessel ischemic disease with small remote left caudate lacunar infarct. These results were communicated to [place] at 11:42 pmon 4/27/2019by text page via the [facility] messaging system. Electronically Signed   By: Rise Mu M.D.   On: 10/06/2017 23:47     STUDIES:   CULTURES:   ANTIBIOTICS: None  SIGNIFICANT EVENTS:  DISCUSSION:      This is a 57 year old with an unknown past medical history who was found behaving bizarre early at a old a outside event.  He has no evidence of rhabdo.  Test x-ray is suggestive of congestive heart failure.  His tox screen was negative.  CTA suggested stenosis of the left ICA with decreased perfusion throughout the left hemisphere.  ASSESSMENT / PLAN:  PULMONARY A: He appears to have very generous vascularity on his chest x-ray this morning but no infiltrate.  I am not going to diurese the patient is with his carotid stenosis and bizarre behavior I do not want to drop his blood pressure.  We will be addressing his likely failure once I have embolized his  neurological situation.  No plan to wean the patient at present as he is not significantly awake off propofol to maintain an airway.  CARDIOVASCULAR A: Rest history is unknown.  There was ST elevation on the monitor this morning with an EKG to my eye shows LVH with repolarization changes his initial troponin was less than 0.03. An echo is pending   RENAL A: No acute issues    GASTROINTESTINAL A: Prophylaxis is with Protonix    ENDOCRINE A: No known issues    NEUROLOGIC A: He remains lethargic but nonfocal with a known left ICA stenosis and abnormal perfusion in left hemisphere.  Want to strictly avoid any hypotension in this patient.  He has been started on a combination  of aspirin and Plavix.  Did receive a substantial amount of Ativan for CT scan this morning, I have resumed low-dose propofol and will reexamine the patient in several hours.  If his exam has not improved I will be repeating the CT scan of the head.  In the interim I have ordered liver function studies and thyroid function studies to determine whether his an as yet undiagnosed metabolic component to his altered mental status.  We have no history on this patient to know if he has functional disease at baseline.   Penny Pia, MD Pulmonary and Critical Care Medicine [facility] Pager: [phone]  10/07/2017, 8:17 AM  Than 32 minutes was spent in the care of this patient with potentially life-threatening neurological disease.

## 2017-10-07 NOTE — BH Assessment (Signed)
Attempted to complete TTS consult, RN stated pt is heavily medicated right now and to try again in a few hours.

## 2017-10-07 NOTE — Progress Notes (Signed)
NEURO HOSPITALIST Progress NOTE   HPI:                                                                                                                                          Phillip Rivas is an 57 y.o. male who was brought in by EMS from the [facility] strip, where he was noted to become acutely altered with stumbling and vomiting. He had been in the sun all day, so sunstroke was a consideration. He was not answering questions appropriately for EMS and was also dysarthric. Stroke was suspected so he was given 324 mg ASA en route. There is no information on file for this patient in Epic and he is unable to relate his PMHx.   Interval History: Pt was intubated for airway protection overnight, but now extubated. His neuro exam is poorly responsive at this time. No family at this time. Database remains incomplete.  MEDICATIONS:                                                                                                                     Unknown  ROS:                                                                                                                                       Unable to obtain due to dysphasia/confusion/AMS.  Blood pressure 127/89, pulse (!) 52, temperature 98.3 F (36.8 C), temperature source Axillary, resp. rate 19, height  (1.88 m), weight (!) 150 kg (330 lb 11 oz), SpO2 97 %.   General Examination:  HEENT-  [place]/AT   Lungs- Respirations unlabored, snorous Abdomen- Obese Extremities- Warm and well perfused   Neurological Examination Mental Status: obtunded. Doesn't follow any commands. Only response is to noxious stimuli, some withdraw bilat UE. No attempt to speak. Cranial Nerves: II: Blinks to threat bilaterally. PERRL, 1mm, dysconjugate gaze.  III,IV, VI: Doesn't track to test EOMI. No forced gaze deviation.  V,VII: face poorly  activated, seems symmetric VIII: unable to test IX,X: unable to test XI: Head at midline XII: Does not protrude tongue to command Motor/Sensory: withdraw bilt UE to noxious stim only Deep Tendon Reflexes: 1+ and symmetric throughout, except for 0 achilles reflexes Plantars: Right: downgoing  Left: downgoing Cerebellar: unable to test Gait: unable   Lab Results: Basic Metabolic Panel: Recent Labs  Lab 10/06/17 1945 10/07/17 0202 10/07/17 0404  NA 135 135  --   K 4.1 4.1  --   CL 100* 101  --   CO2 24 25  --   GLUCOSE 121* 128*  --   BUN 11 10  --   CREATININE 0.93 0.95  --   CALCIUM 9.8 9.2  --   MG  --   --  1.8  PHOS  --   --  2.8    CBC: Recent Labs  Lab 10/06/17 1945 10/07/17 0202  WBC 4.3 4.5  NEUTROABS 3.6 3.5  HGB 14.1 12.8*  HCT 41.4 38.6*  MCV 102.2* 102.9*  PLT 198 171    Cardiac Enzymes: Recent Labs  Lab 10/07/17 0404 10/07/17 0957  CKTOTAL 296  --   TROPONINI <0.03 <0.03    Lipid Panel: Recent Labs  Lab 10/07/17 0202 10/07/17 0356  CHOL 184  --   TRIG 34 52  HDL 125  --   CHOLHDL 1.5  --   VLDL 7  --   LDLCALC 52  --     Imaging: Ct Angio Head W Or Wo Contrast  Result Date: 10/07/2017 CLINICAL DATA:  Initial evaluation for acute expressive aphasia. EXAM: CT ANGIOGRAPHY HEAD AND NECK CT PERFUSION BRAIN TECHNIQUE: Multidetector CT imaging of the head and neck was performed using the standard protocol during bolus administration of intravenous contrast. Multiplanar CT image reconstructions and MIPs were obtained to evaluate the vascular anatomy. Carotid stenosis measurements (when applicable) are obtained utilizing NASCET criteria, using the distal internal carotid diameter as the denominator. Multiphase CT imaging of the brain was performed following IV bolus contrast injection. Subsequent parametric perfusion maps were calculated using RAPID software. CONTRAST:  ISOVUE-370 IOPAMIDOL (ISOVUE-370) INJECTION 76% COMPARISON:  Prior CT  from 10/06/2017. FINDINGS: CTA NECK FINDINGS Aortic arch: Visualized aortic arch of normal caliber with normal 3 vessel morphology. No flow-limiting stenosis about the origin of the great vessels. Partially visualized subclavian arteries widely patent. Right carotid system: Right common and internal carotid arteries are patent without stenosis, dissection, or occlusion. No significant atheromatous narrowing about the right carotid bifurcation. Left carotid system: Left common and internal carotid arteries are patent without stenosis, dissection, or occlusion. No significant atheromatous narrowing about the left carotid bifurcation. Vertebral arteries: Both of the vertebral arteries arise from the subclavian arteries. Vertebral arteries poorly evaluated proximally due to extensive adjacent venous collateralization. Vertebral arteries are grossly patent to the skull base without obvious stenosis. Left vertebral artery slightly dominant. Skeleton: No acute osseus abnormality. No worrisome lytic or blastic osseous lesions. Mild multilevel degenerate spondylolysis noted within cervical spine. Other neck: No acute soft tissue abnormality within the neck. Endotracheal and enteric tubes  in place. Upper chest: Visualized upper mediastinum within normal limits. Dependent atelectatic changes noted within the visualized lungs. Review of the MIP images confirms the above findings CTA HEAD FINDINGS Anterior circulation: Petrous, cavernous, and supraclinoid right ICA patent without flow-limiting stenosis. Right ICA terminus widely patent. Right M1 segment well perfused without stenosis. No proximal right M2 occlusion. Distal right MCA branches well perfused. Petrous and cavernous left ICA widely patent. There is a focal moderate stenosis at the supraclinoid left ICA (series 8, image 118). Left ICA terminus patent distally. Asymmetric atheromatous irregularity throughout the left M1 segment with mild to moderate multifocal  narrowing. No M1 occlusion. Additional focal severe proximal left M2 stenosis (series 10, image 99). No proximal M2 occlusion. Distal left MCA branches perfused with demonstrate asymmetric atheromatous irregularity as compared to the contralateral right MCA. Overall, left MCA branches are attenuated as compared to the contralateral right. A1 segments patent bilaterally. Patent and normal anterior communicating artery. Anterior cerebral artery is perfused to their distal aspects without high-grade stenosis. Posterior circulation: Dominant left vertebral artery patent to the vertebrobasilar junction without flow-limiting stenosis. Slightly diminutive right vertebral artery demonstrates scattered atheromatous irregularity without high-grade stenosis. Posterior inferior cerebral arteries patent bilaterally. Basilar patent to its distal aspect without stenosis. Superior cerebral arteries patent bilaterally. PCAs primarily supplied via the basilar and are patent to their distal aspects. Venous sinuses: Not well evaluated due to timing of the contrast bolus. Anatomic variants: None significant.  No appreciable aneurysm. Delayed phase: Not performed. Review of the MIP images confirms the above findings CT Brain Perfusion Findings: CBF (<30%) Volume: 0mL Perfusion (Tmax>6.0s) volume: 58mL Mismatch Volume: 58mL Infarction Location:No frank infarct by CT perfusion. There is delayed perfusion with perfusion mismatch involving the left cerebral hemisphere, primarily involving the posterior left MCA territory and/or MCA-PCA watershed territory, with additional perfusion abnormality within the deep white matter of the left cerebral hemisphere, left MCA watershed area. Findings suspected to be related to left supraclinoid stenosis with asymmetric atheromatous irregularity within the left MCA distribution. IMPRESSION: 1. Negative CTA for emergent large vessel occlusion. No acute infarct by CT perfusion. 2. 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 Left MCA branches are attenuated as compared to the contralateral right. 3. Delayed perfusion/perfusion mismatch within the left cerebral hemisphere, overall watershed in distribution, likely secondary to the above-mentioned stenoses and atheromatous disease. 4. No other high-grade or correctable stenosis identified within the major arterial vasculature of the head and neck. Critical Value/emergent results were called by telephone at the time of interpretation on 10/07/2017 at 5:14 am to Dr. Otelia Limes, who verbally acknowledged these results. Electronically Signed   By: Rise Mu M.D.   On: 10/07/2017 05:38   Ct Angio Neck W Or Wo Contrast  Result Date: 10/07/2017 CLINICAL DATA:  Initial evaluation for acute expressive aphasia. EXAM: CT ANGIOGRAPHY HEAD AND NECK CT PERFUSION BRAIN TECHNIQUE: Multidetector CT imaging of the head and neck was performed using the standard protocol during bolus administration of intravenous contrast. Multiplanar CT image reconstructions and MIPs were obtained to evaluate the vascular anatomy. Carotid stenosis measurements (when applicable) are obtained utilizing NASCET criteria, using the distal internal carotid diameter as the denominator. Multiphase CT imaging of the brain was performed following IV bolus contrast injection. Subsequent parametric perfusion maps were calculated using RAPID software. CONTRAST:  ISOVUE-370 IOPAMIDOL (ISOVUE-370) INJECTION 76% COMPARISON:  Prior CT from 10/06/2017.  FINDINGS: CTA NECK FINDINGS Aortic arch: Visualized aortic arch of normal caliber with normal 3 vessel morphology. No flow-limiting stenosis about the origin of the great vessels. Partially visualized subclavian arteries widely patent. Right carotid system: Right common and internal carotid arteries are patent  without stenosis, dissection, or occlusion. No significant atheromatous narrowing about the right carotid bifurcation. Left carotid system: Left common and internal carotid arteries are patent without stenosis, dissection, or occlusion. No significant atheromatous narrowing about the left carotid bifurcation. Vertebral arteries: Both of the vertebral arteries arise from the subclavian arteries. Vertebral arteries poorly evaluated proximally due to extensive adjacent venous collateralization. Vertebral arteries are grossly patent to the skull base without obvious stenosis. Left vertebral artery slightly dominant. Skeleton: No acute osseus abnormality. No worrisome lytic or blastic osseous lesions. Mild multilevel degenerate spondylolysis noted within cervical spine. Other neck: No acute soft tissue abnormality within the neck. Endotracheal and enteric tubes in place. Upper chest: Visualized upper mediastinum within normal limits. Dependent atelectatic changes noted within the visualized lungs. Review of the MIP images confirms the above findings CTA HEAD FINDINGS Anterior circulation: Petrous, cavernous, and supraclinoid right ICA patent without flow-limiting stenosis. Right ICA terminus widely patent. Right M1 segment well perfused without stenosis. No proximal right M2 occlusion. Distal right MCA branches well perfused. Petrous and cavernous left ICA widely patent. There is a focal moderate stenosis at the supraclinoid left ICA (series 8, image 118). Left ICA terminus patent distally. Asymmetric atheromatous irregularity throughout the left M1 segment with mild to moderate multifocal narrowing. No M1 occlusion. Additional focal severe proximal left M2 stenosis (series 10, image 99). No proximal M2 occlusion. Distal left MCA branches perfused with demonstrate asymmetric atheromatous irregularity as compared to the contralateral right MCA. Overall, left MCA branches are attenuated as compared to the contralateral  right. A1 segments patent bilaterally. Patent and normal anterior communicating artery. Anterior cerebral artery is perfused to their distal aspects without high-grade stenosis. Posterior circulation: Dominant left vertebral artery patent to the vertebrobasilar junction without flow-limiting stenosis. Slightly diminutive right vertebral artery demonstrates scattered atheromatous irregularity without high-grade stenosis. Posterior inferior cerebral arteries patent bilaterally. Basilar patent to its distal aspect without stenosis. Superior cerebral arteries patent bilaterally. PCAs primarily supplied via the basilar and are patent to their distal aspects. Venous sinuses: Not well evaluated due to timing of the contrast bolus. Anatomic variants: None significant.  No appreciable aneurysm. Delayed phase: Not performed. Review of the MIP images confirms the above findings CT Brain Perfusion Findings: CBF (<30%) Volume: 0mL Perfusion (Tmax>6.0s) volume: 58mL Mismatch Volume: 58mL Infarction Location:No frank infarct by CT perfusion. There is delayed perfusion with perfusion mismatch involving the left cerebral hemisphere, primarily involving the posterior left MCA territory and/or MCA-PCA watershed territory, with additional perfusion abnormality within the deep white matter of the left cerebral hemisphere, left MCA watershed area. Findings suspected to be related to left supraclinoid stenosis with asymmetric atheromatous irregularity within the left MCA distribution. IMPRESSION: 1. Negative CTA for emergent large vessel occlusion. No acute infarct by CT perfusion. 2. 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 Left MCA branches are attenuated as compared to the contralateral right. 3. Delayed perfusion/perfusion mismatch within the left cerebral hemisphere, overall watershed in  distribution, likely secondary to the above-mentioned stenoses and atheromatous disease. 4. No other high-grade or correctable stenosis identified within the major arterial vasculature of the head and neck. Critical  Value/emergent results were called by telephone at the time of interpretation on 10/07/2017 at 5:14 am to Dr. Otelia Limes, who verbally acknowledged these results. Electronically Signed   By: Rise Mu M.D.   On: 10/07/2017 05:38   Ct Cerebral Perfusion W Contrast  Result Date: 10/07/2017 CLINICAL DATA:  Initial evaluation for acute expressive aphasia. EXAM: CT ANGIOGRAPHY HEAD AND NECK CT PERFUSION BRAIN TECHNIQUE: Multidetector CT imaging of the head and neck was performed using the standard protocol during bolus administration of intravenous contrast. Multiplanar CT image reconstructions and MIPs were obtained to evaluate the vascular anatomy. Carotid stenosis measurements (when applicable) are obtained utilizing NASCET criteria, using the distal internal carotid diameter as the denominator. Multiphase CT imaging of the brain was performed following IV bolus contrast injection. Subsequent parametric perfusion maps were calculated using RAPID software. CONTRAST:  ISOVUE-370 IOPAMIDOL (ISOVUE-370) INJECTION 76% COMPARISON:  Prior CT from 10/06/2017. FINDINGS: CTA NECK FINDINGS Aortic arch: Visualized aortic arch of normal caliber with normal 3 vessel morphology. No flow-limiting stenosis about the origin of the great vessels. Partially visualized subclavian arteries widely patent. Right carotid system: Right common and internal carotid arteries are patent without stenosis, dissection, or occlusion. No significant atheromatous narrowing about the right carotid bifurcation. Left carotid system: Left common and internal carotid arteries are patent without stenosis, dissection, or occlusion. No significant atheromatous narrowing about the left carotid bifurcation. Vertebral arteries: Both of  the vertebral arteries arise from the subclavian arteries. Vertebral arteries poorly evaluated proximally due to extensive adjacent venous collateralization. Vertebral arteries are grossly patent to the skull base without obvious stenosis. Left vertebral artery slightly dominant. Skeleton: No acute osseus abnormality. No worrisome lytic or blastic osseous lesions. Mild multilevel degenerate spondylolysis noted within cervical spine. Other neck: No acute soft tissue abnormality within the neck. Endotracheal and enteric tubes in place. Upper chest: Visualized upper mediastinum within normal limits. Dependent atelectatic changes noted within the visualized lungs. Review of the MIP images confirms the above findings CTA HEAD FINDINGS Anterior circulation: Petrous, cavernous, and supraclinoid right ICA patent without flow-limiting stenosis. Right ICA terminus widely patent. Right M1 segment well perfused without stenosis. No proximal right M2 occlusion. Distal right MCA branches well perfused. Petrous and cavernous left ICA widely patent. There is a focal moderate stenosis at the supraclinoid left ICA (series 8, image 118). Left ICA terminus patent distally. Asymmetric atheromatous irregularity throughout the left M1 segment with mild to moderate multifocal narrowing. No M1 occlusion. Additional focal severe proximal left M2 stenosis (series 10, image 99). No proximal M2 occlusion. Distal left MCA branches perfused with demonstrate asymmetric atheromatous irregularity as compared to the contralateral right MCA. Overall, left MCA branches are attenuated as compared to the contralateral right. A1 segments patent bilaterally. Patent and normal anterior communicating artery. Anterior cerebral artery is perfused to their distal aspects without high-grade stenosis. Posterior circulation: Dominant left vertebral artery patent to the vertebrobasilar junction without flow-limiting stenosis. Slightly diminutive right vertebral  artery demonstrates scattered atheromatous irregularity without high-grade stenosis. Posterior inferior cerebral arteries patent bilaterally. Basilar patent to its distal aspect without stenosis. Superior cerebral arteries patent bilaterally. PCAs primarily supplied via the basilar and are patent to their distal aspects. Venous sinuses: Not well evaluated due to timing of the contrast bolus. Anatomic variants: None significant.  No appreciable aneurysm. Delayed phase: Not performed. Review of the MIP images confirms the above findings CT Brain Perfusion Findings: CBF (<30%) Volume: 0mL Perfusion (Tmax>6.0s) volume: 58mL Mismatch Volume: 58mL Infarction Location:No frank infarct  by CT perfusion. There is delayed perfusion with perfusion mismatch involving the left cerebral hemisphere, primarily involving the posterior left MCA territory and/or MCA-PCA watershed territory, with additional perfusion abnormality within the deep white matter of the left cerebral hemisphere, left MCA watershed area. Findings suspected to be related to left supraclinoid stenosis with asymmetric atheromatous irregularity within the left MCA distribution. IMPRESSION: 1. Negative CTA for emergent large vessel occlusion. No acute infarct by CT perfusion. 2. 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 Left MCA branches are attenuated as compared to the contralateral right. 3. Delayed perfusion/perfusion mismatch within the left cerebral hemisphere, overall watershed in distribution, likely secondary to the above-mentioned stenoses and atheromatous disease. 4. No other high-grade or correctable stenosis identified within the major arterial vasculature of the head and neck. Critical Value/emergent results were called by telephone at the time of interpretation on 10/07/2017 at 5:14 am to Dr. Otelia Limes, who  verbally acknowledged these results. Electronically Signed   By: Rise Mu M.D.   On: 10/07/2017 05:38   Dg Chest Portable 1 View  Result Date: 10/07/2017 CLINICAL DATA:  Intubation.  OG insertion. EXAM: PORTABLE CHEST 1 VIEW COMPARISON:  None. FINDINGS: Endotracheal tube placed with tip measuring 4.1 cm above the carina. Enteric tube was placed. Tip is off the field of view but below the left hemidiaphragm. Proximal side hole is at or just below the EG junction region. Shallow inspiration. Cardiac enlargement. Probable vascular congestion. No edema or consolidation. Atelectasis in the lung bases. No blunting of costophrenic angles. No pneumothorax. IMPRESSION: Appliances appear in satisfactory position. Shallow inspiration with atelectasis in the lung bases. Cardiac enlargement with vascular congestion. No edema or consolidation. Electronically Signed   By: Burman Nieves M.D.   On: 10/07/2017 03:38   Ct Head Code Stroke Wo Contrast`  Result Date: 10/06/2017 CLINICAL DATA:  Code stroke. Initial evaluation for acute altered mental status. EXAM: CT HEAD WITHOUT CONTRAST TECHNIQUE: Contiguous axial images were obtained from the base of the skull through the vertex without intravenous contrast. COMPARISON:  None available. FINDINGS: Brain: Generalized age-related cerebral atrophy with mild chronic small vessel ischemic disease. Small remote lacunar infarct noted within the left caudate. No acute intracranial hemorrhage. No definite acute large vessel territory infarct. No mass lesion, midline shift or mass effect. No hydrocephalus. No extra-axial fluid collection. Vascular: No asymmetric hyperdense vessel. Scattered vascular calcifications noted within the carotid siphons. Skull: Scalp soft tissues and calvarium within normal limits. Sinuses/Orbits: Globes and orbital soft tissues demonstrate no acute abnormality. Remote posttraumatic defect noted at the left lamina for pre shift. Changes related  chronic maxillary sinusitis noted. Retention cysts noted within left sphenoid sinus. No mastoid effusion. Other: None. ASPECTS [facility] Stroke Program Early CT Score) - Ganglionic level infarction (caudate, lentiform nuclei, internal capsule, insula, M1-M3 cortex): 7 - Supraganglionic infarction (M4-M6 cortex): 3 Total score (0-10 with 10 being normal): 10 IMPRESSION: 1. No acute intracranial infarct or other abnormality identified. 2. ASPECTS is 10. 3. Atrophy with mild chronic small vessel ischemic disease with small remote left caudate lacunar infarct. These results were communicated to [place] at 11:42 pmon 4/27/2019by text page via the [facility] messaging system. Electronically Signed   By: Rise Mu M.D.   On: 10/06/2017 23:47    Assessment: 57 year old male with AMS/dysphasia 1. Encephalopathy, AMS- Exam with fluent speech alternating with word salad and comprehension deficit. No  focal weakness, facial droop or ataxia noted. DDx includes left perisylvian stroke/TIA versus heat stroke versus toxic/metabolic encephalopathy.  2. L ICA with asymmetric and irreg atheromatous, extends through M1, then severe stenosis seen in L M2 segment and distal brances. This is c/w CTP deficit seen throughout the overall left watershed distribution.   -CTH showed no acute infarct, no LVO  -Will order MRI brain to better evaluate  -Neck and extracranial vessels are without stenosis  -LDL 52, far below goal <70. However given degree of athero, statin is still indicated  -A1c low, no dx dm2 known 3. Severe agitation- toxic wk up underway, unsure of underlying state. Known to "drink", UAD neg. 4. Unknown PMHx- incomplete database 5. Respiratory distress- Intubated d/t encephalopathy and AMS. SLP consult once extubated  Recommendations/Plan: MRI brain to better evaluate  Stroke [place] up in progress, we will f/u once results posted  Frequent neuro checks.  Continue with antiplatelets + statin Agree with  further toxic metabolic work up, replace electrolytes etc  Seen with [employer] fundraiser. Updated and d/w Dr Chandra Batch, attending neurologist  spent of neurological evaluation and management of this critically ill patient.  Kinsleigh Ludolph Metzger-Cihelka, ARNP-C [facility]

## 2017-10-07 NOTE — Progress Notes (Signed)
SLP Cancellation Note  Patient Details Name: Phillip Rivas MRN: [id number] DOB: 1960/07/22   Cancelled treatment:       Reason Eval/Treat Not Completed: Medical issues which prohibited therapy  Rondel Baton, MS, CCC-SLP Speech-Language Pathologist [phone] Arlana [place]au 10/07/2017, 8:07 AM

## 2017-10-07 NOTE — Code Documentation (Signed)
Code stroke, while in CT, patient got acutely very agitated/aggressive, it took 7 staff members to help keep the patient safe. Patient was given  Ativan IV and  Haldol IV. Decision made to take patient back to ED and to bring patient back to CT when patient is calm.

## 2017-10-07 NOTE — Progress Notes (Signed)
Pt transported to CT without event. 

## 2017-10-07 NOTE — Progress Notes (Signed)
Pt transported to 4N 29 without event.

## 2017-10-07 NOTE — Progress Notes (Signed)
Hospitalist, Dr. Tamala Julian, requested I intubate patient because of inability to obtain appropriate imaging studies with his degree of agitation, and inability to adequately sedate him.  He has discussed with critical care to transfer to critical care service.  Procedure Name: Intubation Date/Time: 10/07/2017 2:52 AM Performed by: Delora Fuel, MD Pre-anesthesia Checklist: Patient identified, Patient being monitored, Emergency Drugs available, Timeout performed and Suction available Oxygen Delivery Method: Non-rebreather mask Preoxygenation: Pre-oxygenation with 100% oxygen Induction Type: Rapid sequence Ventilation: Mask ventilation without difficulty Laryngoscope Size: Glidescope, Mac and 3 Grade View: Grade I Tube size: 8.0 mm Number of attempts: 1 Airway Equipment and Method: Video-laryngoscopy and Stylet Placement Confirmation: ETT inserted through vocal cords under direct vision,  CO2 detector and Breath sounds checked- equal and bilateral.  Portable chest x-ray has been ordered. Secured at: 25 cm Tube secured with: ETT holder Dental Injury: Teeth and Oropharynx as per pre-operative assessment

## 2017-10-07 NOTE — ED Notes (Signed)
Attempted to readjust pt in bed when pt opened eyes and began pulling against violent restraints.

## 2017-10-07 NOTE — H&P (Signed)
PULMONARY / CRITICAL CARE MEDICINE   Name: Phillip Rivas MRN: [id number] DOB: 01/31/1961    ADMISSION DATE:  10/06/2017 CONSULTATION DATE: 10/07/2017  REFERRING MD:  Dr. Katrinka Blazing   CHIEF COMPLAINT:  Encephalopathy   HISTORY OF PRESENT ILLNESS:   57 year old male with unknown medical history   Presents to ED on 4/27 after being found stumbling, confused, and vomiting. Upon arrival patient was alert, however confused, dysarthric, and not following commands. Code Stroke called. During CT scan patient became acutely agitated requiring ativan and haldol. However, patient ultimately required intubation. PCCM asked to admit.   PAST MEDICAL HISTORY :  He  has no past medical history on file.  PAST SURGICAL HISTORY: He  has no past surgical history on file.  Allergies not on file  No current facility-administered medications on file prior to encounter.    No current outpatient medications on file prior to encounter.    FAMILY HISTORY:  His has no family status information on file.    SOCIAL HISTORY: He  reports that he drinks alcohol.  REVIEW OF SYSTEMS:   Unable to review as patient is intubated and sedated   SUBJECTIVE:   VITAL SIGNS: BP 121/90   Pulse 97   Temp 99 F (37.2 C) (Oral)   Resp 18   Ht  (1.88 m)   Wt (!) 150 kg (330 lb 11 oz)   SpO2 98%   BMI 42.46 kg/m   HEMODYNAMICS:    VENTILATOR SETTINGS: Vent Mode: PRVC FiO2 (%):  [100 %] 100 % Set Rate:  [20 bmp] 20 bmp Vt Set:  [540 mL] 540 mL PEEP:  [5 cmH20] 5 cmH20 Plateau Pressure:  [25 cmH20] 25 cmH20  INTAKE / OUTPUT: No intake/output data recorded.  PHYSICAL EXAMINATION: General:  Adult male, on vent  Neuro:  Sedated, does not follow commands, pupils intact, withdrawals from pain  HEENT:  ETT in place  Cardiovascular:  RRR, no MRG Lungs:  Clear breath sounds, no wheeze/crackles  Abdomen:  Soft, non-distended, active bowel sounds  Musculoskeletal:  -edema  Skin:  Warm, dry, intact    LABS:  BMET Recent Labs  Lab 10/06/17 1945 10/07/17 0202  NA 135 135  K 4.1 4.1  CL 100* 101  CO2 24 25  BUN 11 10  CREATININE 0.93 0.95  GLUCOSE 121* 128*    Electrolytes Recent Labs  Lab 10/06/17 1945 10/07/17 0202  CALCIUM 9.8 9.2    CBC Recent Labs  Lab 10/06/17 1945 10/07/17 0202  WBC 4.3 4.5  HGB 14.1 12.8*  HCT 41.4 38.6*  PLT 198 171    Coag's No results for input(s): APTT, INR in the last 168 hours.  Sepsis Markers No results for input(s): LATICACIDVEN, PROCALCITON, O2SATVEN in the last 168 hours.  ABG No results for input(s): PHART, PCO2ART, PO2ART in the last 168 hours.  Liver Enzymes Recent Labs  Lab 10/06/17 1945 10/07/17 0202  AST 75* 52*  ALT 91* 74*  ALKPHOS 60 51  BILITOT 1.3* 1.2  ALBUMIN 4.3 3.7    Cardiac Enzymes No results for input(s): TROPONINI, PROBNP in the last 168 hours.  Glucose No results for input(s): GLUCAP in the last 168 hours.  Imaging Dg Chest Portable 1 View  Result Date: 10/07/2017 CLINICAL DATA:  Intubation.  OG insertion. EXAM: PORTABLE CHEST 1 VIEW COMPARISON:  None. FINDINGS: Endotracheal tube placed with tip measuring 4.1 cm above the carina. Enteric tube was placed. Tip is off the field of view but  below the left hemidiaphragm. Proximal side hole is at or just below the EG junction region. Shallow inspiration. Cardiac enlargement. Probable vascular congestion. No edema or consolidation. Atelectasis in the lung bases. No blunting of costophrenic angles. No pneumothorax. IMPRESSION: Appliances appear in satisfactory position. Shallow inspiration with atelectasis in the lung bases. Cardiac enlargement with vascular congestion. No edema or consolidation. Electronically Signed   By: Burman Nieves M.D.   On: 10/07/2017 03:38   Ct Head Code Stroke Wo Contrast`  Result Date: 10/06/2017 CLINICAL DATA:  Code stroke. Initial evaluation for acute altered mental status. EXAM: CT HEAD WITHOUT CONTRAST TECHNIQUE:  Contiguous axial images were obtained from the base of the skull through the vertex without intravenous contrast. COMPARISON:  None available. FINDINGS: Brain: Generalized age-related cerebral atrophy with mild chronic small vessel ischemic disease. Small remote lacunar infarct noted within the left caudate. No acute intracranial hemorrhage. No definite acute large vessel territory infarct. No mass lesion, midline shift or mass effect. No hydrocephalus. No extra-axial fluid collection. Vascular: No asymmetric hyperdense vessel. Scattered vascular calcifications noted within the carotid siphons. Skull: Scalp soft tissues and calvarium within normal limits. Sinuses/Orbits: Globes and orbital soft tissues demonstrate no acute abnormality. Remote posttraumatic defect noted at the left lamina for pre shift. Changes related chronic maxillary sinusitis noted. Retention cysts noted within left sphenoid sinus. No mastoid effusion. Other: None. ASPECTS [facility] Stroke Program Early CT Score) - Ganglionic level infarction (caudate, lentiform nuclei, internal capsule, insula, M1-M3 cortex): 7 - Supraganglionic infarction (M4-M6 cortex): 3 Total score (0-10 with 10 being normal): 10 IMPRESSION: 1. No acute intracranial infarct or other abnormality identified. 2. ASPECTS is 10. 3. Atrophy with mild chronic small vessel ischemic disease with small remote left caudate lacunar infarct. These results were communicated to [place] at 11:42 pmon 4/27/2019by text page via the [facility] messaging system. Electronically Signed   By: Rise Mu M.D.   On: 10/06/2017 23:47     STUDIES:  CXR 4/28 > Endotracheal tube placed with tip measuring 4.1 cm above the carina. Enteric tube was placed. Tip is off the field of view but below the left hemidiaphragm. Proximal side hole is at or just below the EG junction region.Shallow inspiration. Cardiac enlargement. Probable vascular congestion. No edema or consolidation. Atelectasis in the  lung bases. No blunting of costophrenic angles. No pneumothorax.   CULTURES: Blood 4/28 >> Sputum 4/28 >> U/A 4/28 >>   ANTIBIOTICS: None.   SIGNIFICANT EVENTS: 4/28 > Presents to ED   LINES/TUBES: ETT 4/7 >>   DISCUSSION: 57 year old male with unknown medical history presents to ED on 4/27 after being found stumbling, confused, and vomiting. Intubated due to severe encephalopathy.   ASSESSMENT / PLAN:  PULMONARY A: Respiratory Insufficieny in setting of encephalopathy  P:   Vent Support > Currently on 8 cc/kg  Trend ABG and CXR  Pulmonary Hygiene  VAP Bundle   CARDIOVASCULAR A:  HTN  Cardiac Enlargement noted on imaging  P:  Cardiac Monitoring  Maintain Systolic <160 Trend Troponin, BNP, CK  ECHO pending   RENAL A:   No issues  Given 1L NS in ED  P:   Trend BMP Replace electrolytes as indicated  NS @ 100 ml/hr   GASTROINTESTINAL A:   SUP P:   NPO PPI  HEMATOLOGIC A:   DVT Prophylaxis   P:  Trend CBC  Lovenox SQ  INFECTIOUS A:   Afebrile, WBC 4.5 P:   Trend WBC and Fever Curve Trend PCT  and LA  Follow Culture Data   ENDOCRINE A:   No issues    P:   Trend Glucose   NEUROLOGIC A:   Encephalopathy  UDS negative  ETOH <10  Salicylate <7 Tylenol <10 Aphasia  P:   RASS goal: 0/-1 Neurology consulted  CTA Head/Neck pending  Titrate Propofol/Fentanyl gtt to achieve RASS   FAMILY  - Updates: no family at bedside   - Inter-disciplinary family meet or Palliative Care meeting due by: 10/14/2017     Jovita Kussmaul, AGACNP-BC [facility]  Pgr: [phone]  PCCM Pgr: [phone]

## 2017-10-08 ENCOUNTER — Inpatient Hospital Stay (HOSPITAL_COMMUNITY): Payer: Medicaid Other

## 2017-10-08 DIAGNOSIS — G9340 Encephalopathy, unspecified: Secondary | ICD-10-CM

## 2017-10-08 LAB — BASIC METABOLIC PANEL
ANION GAP: 6 (ref 5–15)
BUN: 10 mg/dL (ref 6–20)
CHLORIDE: 111 mmol/L (ref 101–111)
CO2: 22 mmol/L (ref 22–32)
Calcium: 9.1 mg/dL (ref 8.9–10.3)
Creatinine, Ser: 0.78 mg/dL (ref 0.61–1.24)
Glucose, Bld: 92 mg/dL (ref 65–99)
Potassium: 3.8 mmol/L (ref 3.5–5.1)
SODIUM: 139 mmol/L (ref 135–145)

## 2017-10-08 LAB — CBC WITH DIFFERENTIAL/PLATELET
BASOS ABS: 0 10*3/uL (ref 0.0–0.1)
BASOS PCT: 0 %
EOS ABS: 0 10*3/uL (ref 0.0–0.7)
Eosinophils Relative: 0 %
HCT: 39.7 % (ref 39.0–52.0)
Hemoglobin: 13.1 g/dL (ref 13.0–17.0)
Lymphocytes Relative: 9 %
Lymphs Abs: 0.7 10*3/uL (ref 0.7–4.0)
MCH: 34.4 pg — ABNORMAL HIGH (ref 26.0–34.0)
MCHC: 33 g/dL (ref 30.0–36.0)
MCV: 104.2 fL — ABNORMAL HIGH (ref 78.0–100.0)
MONOS PCT: 8 %
Monocytes Absolute: 0.7 10*3/uL (ref 0.1–1.0)
Neutro Abs: 6.5 10*3/uL (ref 1.7–7.7)
Neutrophils Relative %: 83 %
Platelets: 156 10*3/uL (ref 150–400)
RBC: 3.81 MIL/uL — ABNORMAL LOW (ref 4.22–5.81)
RDW: 12.4 % (ref 11.5–15.5)
WBC: 7.9 10*3/uL (ref 4.0–10.5)

## 2017-10-08 LAB — GLUCOSE, CAPILLARY
GLUCOSE-CAPILLARY: 76 mg/dL (ref 65–99)
GLUCOSE-CAPILLARY: 109 mg/dL — AB (ref 65–99)
Glucose-Capillary: 79 mg/dL (ref 65–99)
Glucose-Capillary: 118 mg/dL — ABNORMAL HIGH (ref 65–99)
Glucose-Capillary: 121 mg/dL — ABNORMAL HIGH (ref 65–99)

## 2017-10-08 LAB — MAGNESIUM: MAGNESIUM: 1.8 mg/dL (ref 1.7–2.4)

## 2017-10-08 LAB — PROCALCITONIN

## 2017-10-08 LAB — PHOSPHORUS: PHOSPHORUS: 3.3 mg/dL (ref 2.5–4.6)

## 2017-10-08 MED ORDER — PANTOPRAZOLE SODIUM 40 MG PO TBEC
40.0000 mg | DELAYED_RELEASE_TABLET | Freq: Every day | ORAL | Status: DC
  Administered 2017-10-08 – 2017-10-09 (×2): 40 mg via ORAL
  Filled 2017-10-08 (×2): qty 1

## 2017-10-08 NOTE — Progress Notes (Signed)
Phillip Rivas [id number]  Code Status: FULL  Admission Data: 10/08/2017 8:20 PM  Attending Provider: Hammonds JXB:[id number], No Pcp Per  Consults/ Treatment Team: Treatment Team:  Pccm, Md, MD  [name] is a 57 y.o. male patient admitted from 4N awake, alert - oriented X 4 - no acute distress noted. VSS - Blood pressure 121/75, pulse (!) 104, temperature 98.3 F (36.8 C), temperature source Oral, resp. rate 20, height  (1.88 m), weight 117.3 kg (258 lb 9.6 oz), SpO2 97 %. no c/o shortness of breath, no c/o chest pain. No past medical history on file. Orientation to room, and floor completed. Patient declined safety video at this time. Admission INP armband ID verified with patient, and in place.  Patient's restraints were discontinued this morning per ED. Patient calm and cooperative. SR up x 2, fall assessment complete, with patient and family able to verbalize understanding of risk associated with falls, and verbalized understanding to call nsg before up out of bed. Call light within reach, patient able to voice, and demonstrate understanding. Skin, clean-dry- intact without evidence of bruising, or skin tears.  No evidence of skin break down noted on exam.  ?  Will cont to eval and treat per MD orders.  Jon Gills, RN  10/08/2017 8:20 PM

## 2017-10-08 NOTE — Progress Notes (Signed)
PULMONARY / CRITICAL CARE MEDICINE   Name: Phillip Rivas MRN: [id number] DOB: 1961/02/16    ADMISSION DATE:  10/06/2017    CHIEF COMPLAINT: Altered mental status  HISTORY OF PRESENT ILLNESS:        This is a 57 year old who [place]  He is brought to the department emergency medicine for altered mental status where he required intubation mechanical ventilation to allow diagnostic studies.  Tox screen was negative.  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  He required 6 mg of Ativan in the wee hours of the morning to facilitate his CT scan.  He was subsequently extubated on 4/28.  He was intermittently agitated overnight last night and was transiently on a low-dose of Precedex.  He is entirely alert and appropriately interactive for me this morning.  He tells me it was very hot at the races where he was before being admitted and that he became dizzy.  He then says when he tried to drink some water he threw up which led to his being brought to the hospital.  He denies ingestion of drugs which may not have shown up on her tox screen specifically he denies ever using ecstasy or moly.  He tells me he drinks beer daily but always less than a six pack.  PAST MEDICAL HISTORY :  He  has no past medical history on file.  PAST SURGICAL HISTORY: He  has no past surgical history on file.  Not on File  No current facility-administered medications on file prior to encounter.    No current outpatient medications on file prior to encounter.    FAMILY HISTORY:  His has no family status information on file.    SOCIAL HISTORY: He  reports that he drinks alcohol.  REVIEW OF SYSTEMS:   Unobtainable.  His past medical history is unknown  SUBJECTIVE:  Unobtainable  VITAL SIGNS: BP 115/87   Pulse 67   Temp 98.2 F (36.8  C) (Oral)   Resp (!) 24   Ht  (1.88 m)   Wt 258 lb 9.6 oz (117.3 kg)   SpO2 99%   BMI 33.20 kg/m   HEMODYNAMICS:    VENTILATOR SETTINGS:    INTAKE / OUTPUT: I/O last 3 completed shifts: In: 4813.3 [I.V.:3263.3; IV Piggyback:1550] Out: 1470 [Urine:1470]  PHYSICAL EXAMINATION: General: He is calmly interactive with me and in no distress.   Neuro: He is oriented x3 and appropriately interactive.  Pupils are equal and he is using all fours. Cardiovascular: S1 and S2 are regular without murmur rub or gallop. Lungs: Respirations are unlabored, there is symmetric air movement, no wheezes there are no dependent rales.   Abdomen: The abdomen is soft without any organomegaly masses or tenderness   LABS:  BMET Recent Labs  Lab 10/06/17 1945 10/07/17 0202 10/08/17 0635  NA 135 135 139  K 4.1 4.1 3.8  CL 100* 101 111  CO2 BUN CREATININE 0.93 0.95 0.78  GLUCOSE 121* 128* 92    Electrolytes Recent Labs  Lab 10/06/17 1945 10/07/17 0202 10/07/17 0404 10/08/17 0635  CALCIUM 9.8 9.2  --  9.1  MG  --   --  1.8 1.8  PHOS  --   --  2.8 3.3    CBC Recent Labs  Lab 10/06/17 1945  10/07/17 0202 10/08/17 0635  WBC 4.3 4.5 7.9  HGB 14.1 12.8* 13.1  HCT 41.4 38.6* 39.7  PLT 198 171 156    Coag's No results for input(s): APTT, INR in the last 168 hours.  Sepsis Markers Recent Labs  Lab 10/07/17 0356  LATICACIDVEN 0.8  PROCALCITON 0.10    ABG Recent Labs  Lab 10/07/17 0350 10/07/17 0819  PHART 7.337* 7.389  PCO2ART 42.6 35.8  PO2ART 303.0* 107.0    Liver Enzymes Recent Labs  Lab 10/06/17 1945 10/07/17 0202  AST 75* 52*  ALT 91* 74*  ALKPHOS 60 51  BILITOT 1.3* 1.2  ALBUMIN 4.3 3.7    Cardiac Enzymes Recent Labs  Lab 10/07/17 0404 10/07/17 0957 10/07/17 1653  TROPONINI <0.03 <0.03 <0.03    Glucose Recent Labs  Lab 10/07/17 1209 10/07/17 1558 10/07/17 1938 10/07/17 2333 10/08/17 0335 10/08/17 0804  GLUCAP  104* 119* 123* 111* 76 109*    Imaging Mr Brain Wo Contrast  Result Date: 10/07/2017 CLINICAL DATA:  Stroke follow-up EXAM: MRI HEAD WITHOUT CONTRAST TECHNIQUE: Multiplanar, multiecho pulse sequences of the brain and surrounding structures were obtained without intravenous contrast. COMPARISON:  CT and CTA from yesterday and earlier today. FINDINGS: Brain: No acute infarction, hemorrhage, hydrocephalus, extra-axial collection or mass lesion. Mild periventricular FLAIR hyperintensity is nonspecific but usually chronic small vessel ischemia. Probable lacune at the left caudate nucleus. Vascular: Major flow voids are preserved Skull and upper cervical spine: No evidence of marrow lesion Sinuses/Orbits: Remote blowout fracture of the medial wall left orbit. Minor mucosal thickening in the paranasal sinuses. Other: Motion degraded. IMPRESSION: 1. No acute finding including infarct. 2. Mild chronic small vessel ischemia. 3. Motion degraded. Electronically Signed   By: Marnee Spring M.D.   On: 10/07/2017 17:48     STUDIES:   CULTURES:   ANTIBIOTICS: None  SIGNIFICANT EVENTS:  DISCUSSION:      This is a 57 year old with an unknown past medical history who was found behaving strangely in an outdoor event.  He has no evidence of rhabdo.   His tox screen was negative.  CTA suggested stenosis of the left ICA with decreased perfusion throughout the left hemisphere.  ASSESSMENT / PLAN:  PULMONARY A: Despite an x-ray yesterday that suggested generous vascularity he is tolerated extubation very well and has no stigmata of failure on today's examination   CARDIOVASCULAR A: An echo is pending to evaluate LVH on his EKG and a large heart on chest x-ray  RENAL A: No acute issues    GASTROINTESTINAL A: Prophylaxis is with Protonix    ENDOCRINE A: No known issues    NEUROLOGIC A: He is remarkably alert and appropriately interactive today without any focal findings.  I suspect his bizarre  behavior was on the basis of dehydration although he did not have a significant rise in hemoglobin creatinine or CPK.  What role the stenosis in his internal carotid may have played and contributing to his altered mental status when he was dehydrated is not clear to me.  I am awaiting input from neurology.  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  He is stable for transfer out of intensive care.   Penny Pia, MD Pulmonary and Critical Care Medicine [facility] Pager: [phone]  10/08/2017, 8:57 AM

## 2017-10-08 NOTE — Procedures (Signed)
ELECTROENCEPHALOGRAM REPORT  Date of Study: 10/08/2017  Patient's Name: Phillip Rivas MRN: [id number] Date of Birth: 17-Aug-1960  Referring Provider: Delia Heady, MD  Clinical History: 57 year old male with altered mental status  Medications: Aspirin atrovastatin  Technical Summary: A multichannel digital EEG recording measured by the international 10-20 system with electrodes applied with paste and impedances below 5000 ohms performed in our laboratory with EKG monitoring in an awake patient.  Hyperventilation was not performed.  Photic stimulation was performed.  The digital EEG was referentially recorded, reformatted, and digitally filtered in a variety of bipolar and referential montages for optimal display.    Description: The patient is awake during the recording.  During maximal wakefulness, there is a symmetric, medium voltage 9 Hz posterior dominant rhythm that attenuates with eye opening.  The record is symmetric.  Stage 2 sleep is not seen.  Photic stimulation did not elicit any abnormalities.  There were no epileptiform discharges or electrographic seizures seen.    EKG lead was unremarkable.  Impression: This awake EEG is normal.    Clinical Correlation: A normal EEG does not exclude a clinical diagnosis of epilepsy.  If further clinical questions remain, prolonged EEG may be helpful.  Clinical correlation is advised.   Shon Millet, DO

## 2017-10-08 NOTE — Progress Notes (Signed)
Pt placed on CPAP for the night- Tolerating well at this time.

## 2017-10-08 NOTE — Evaluation (Signed)
Clinical/Bedside Swallow Evaluation Patient Details  Name: Phillip Rivas MRN: [id number] Date of Birth: 11/14/1960  Today's Date: 10/08/2017 Time: SLP Start Time (ACUTE ONLY): 1312 SLP Stop Time (ACUTE ONLY): 1320 SLP Time Calculation (min) (ACUTE ONLY): 8 min  Past Medical History: No past medical history on file. Past Surgical History:  HPI:  This is a 57 year old who was [place]  He is brought to the department emergency medicine for altered mental status where he required intubation mechanical ventilation to allow diagnostic studies.  Tox screen was negative.  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  He required 6 mg of Ativan in the wee hours of the morning to facilitate his CT scan.  MRI negative.    Assessment / Plan / Recommendation Clinical Impression  Pt demonstrates normal swallow function. May initiate a regular diet and thin liquids. No SLP f/u needed will sign off.  SLP Visit Diagnosis: Dysphagia, unspecified (R13.10)    Aspiration Risk  Mild aspiration risk    Diet Recommendation Regular;Thin liquid   Liquid Administration via: Cup;Straw Medication Administration: Whole meds with liquid    Other  Recommendations     Follow up Recommendations        Frequency and Duration            Prognosis        Swallow Study   General HPI: This is a 57 year old who was [place].  He is brought to the department emergency medicine for altered mental status where he required intubation mechanical ventilation to allow diagnostic studies.  Tox screen was negative.  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  He required 6 mg of Ativan in  the wee hours of the morning to facilitate his CT scan.  MRI negative.  Type of Study: Bedside Swallow Evaluation Diet Prior to this Study: NPO Temperature Spikes Noted: No Respiratory Status: Room air History of Recent Intubation: Yes Length of Intubations (days): 2 days Date extubated: 10/07/17 Behavior/Cognition: Alert;Cooperative;Pleasant mood Oral Cavity Assessment: Within Functional Limits Oral Care Completed by SLP: No Oral Cavity - Dentition: Adequate natural dentition Vision: Functional for self-feeding Self-Feeding Abilities: Able to feed self Patient Positioning: Upright in bed Baseline Vocal Quality: Normal Volitional Cough: Strong Volitional Swallow: Able to elicit    Oral/Motor/Sensory Function Overall Oral Motor/Sensory Function: Within functional limits   Ice Chips     Thin Liquid Thin Liquid: Within functional limits Presentation: Cup;Straw;Self Fed    Nectar Thick Nectar Thick Liquid: Not tested   Honey Thick Honey Thick Liquid: Not tested   Puree Puree: Within functional limits Presentation: Spoon;Self Fed   Solid   GO   Solid: Within functional limits Presentation: Self Floyce Stakes, MA CCC-SLP [phone]  Claudine Mouton 10/08/2017,1:21 PM

## 2017-10-08 NOTE — Progress Notes (Signed)
Patient's foley catheter was removed at 0920. Patient tolerated removal well. Patient is expected to void by 1520 (six hours post removal). Urinal is at bedside. Will continue to monitor patient.

## 2017-10-08 NOTE — Progress Notes (Signed)
Stroke Team Progress Note  Phillip Rivas is an 57 y.o. male who was brought in by EMS from the [facility] [place], where he was noted to become acutely altered with stumbling and vomiting. He had been in the sun all day, so sunstroke was a consideration. He was not answering questions appropriately for EMS and was also dysarthric. Stroke was suspected so he was given 324 mg ASA en route. he required intubation mechanical ventilation to allow diagnostic studies.  Tox screen was negative.  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  He required 6 mg of Ativan in the wee hours of the morning to facilitate his CT scan. He was subsequently extubated on 10/07/17    SUBJECTIVE  he was intermittently agitated overnight and was transiently on low-dose Precedex. This morning is quite alert and appropriate and interactive. He does report that he felt dizzy and lightheaded and like throwing up before he passed out [place] the race track. She denied ingestion of any recreational drugs and did admit to drinking beer daily. He denies any prior history of seizures. EEG done today shows no seizure activity. MRI scan of the brain was also obtained and shows no acute stroke.  OBJECTIVE Most recent Vital Signs: Temp: 98.4 F (36.9 C) (04/29 1200) Temp Source: Oral (04/29 1200) BP: 133/94 (04/29 1400) Pulse Rate: 71 (04/29 1400) Respiratory Rate: (!) 27 O2 Saturdation: 100%  CBG (last 3)  Recent Labs    10/08/17 0335 10/08/17 0804 10/08/17 1118  GLUCAP 76 109* 79    Diet: Fall precautions Aspiration precautions Diet regular Room service appropriate? Yes; Fluid consistency: Thin   liquids  Activity: Up with assistance      Studies: Results for orders placed or performed during the hospital encounter of 10/06/17 (from the past 24 hour(s))  Glucose, capillary      Status: Abnormal   Collection Time: 10/07/17  3:58 PM  Result Value Ref Range   Glucose-Capillary 119 (H) 65 - 99 mg/dL   Comment 1 Notify RN    Comment 2 Document in Chart   Troponin I (q 6hr x 3)     Status: None   Collection Time: 10/07/17  4:53 PM  Result Value Ref Range   Troponin I <0.03 <0.03 ng/mL  Glucose, capillary     Status: Abnormal   Collection Time: 10/07/17  7:38 PM  Result Value Ref Range   Glucose-Capillary 123 (H) 65 - 99 mg/dL  Glucose, capillary     Status: Abnormal   Collection Time: 10/07/17 11:33 PM  Result Value Ref Range   Glucose-Capillary 111 (H) 65 - 99 mg/dL  Glucose, capillary     Status: None   Collection Time: 10/08/17  3:35 AM  Result Value Ref Range   Glucose-Capillary 76 65 - 99 mg/dL  Procalcitonin     Status: None   Collection Time: 10/08/17  6:35 AM  Result Value Ref Range   Procalcitonin <0.10 ng/mL  Basic metabolic panel     Status: None   Collection Time: 10/08/17  6:35 AM  Result Value Ref Range   Sodium 139 135 - 145 mmol/L   Potassium 3.8 3.5 - 5.1 mmol/L   Chloride 111 101 - 111 mmol/L   CO2 22 22 - 32 mmol/L   Glucose, Bld 92 65 - 99 mg/dL   BUN 10 6 - 20 mg/dL  Creatinine, Ser 0.78 0.61 - 1.24 mg/dL   Calcium 9.1 8.9 - 16.1 mg/dL   GFR calc non Af Amer >60 >60 mL/min   GFR calc Af Amer >60 >60 mL/min   Anion gap 6 5 - 15  Magnesium     Status: None   Collection Time: 10/08/17  6:35 AM  Result Value Ref Range   Magnesium 1.8 1.7 - 2.4 mg/dL  Phosphorus     Status: None   Collection Time: 10/08/17  6:35 AM  Result Value Ref Range   Phosphorus 3.3 2.5 - 4.6 mg/dL  CBC with Differential/Platelet     Status: Abnormal   Collection Time: 10/08/17  6:35 AM  Result Value Ref Range   WBC 7.9 4.0 - 10.5 K/uL   RBC 3.81 (L) 4.22 - 5.81 MIL/uL   Hemoglobin 13.1 13.0 - 17.0 g/dL   HCT 09.6 04.5 - 40.9 %   MCV 104.2 (H) 78.0 - 100.0 fL   MCH 34.4 (H) 26.0 - 34.0 pg   MCHC 33.0 30.0 - 36.0 g/dL   RDW 81.1 91.4 - 78.2 %    Platelets 156 150 - 400 K/uL   Neutrophils Relative % 83 %   Neutro Abs 6.5 1.7 - 7.7 K/uL   Lymphocytes Relative 9 %   Lymphs Abs 0.7 0.7 - 4.0 K/uL   Monocytes Relative 8 %   Monocytes Absolute 0.7 0.1 - 1.0 K/uL   Eosinophils Relative 0 %   Eosinophils Absolute 0.0 0.0 - 0.7 K/uL   Basophils Relative 0 %   Basophils Absolute 0.0 0.0 - 0.1 K/uL  Glucose, capillary     Status: Abnormal   Collection Time: 10/08/17  8:04 AM  Result Value Ref Range   Glucose-Capillary 109 (H) 65 - 99 mg/dL   Comment 1 Notify RN    Comment 2 Document in Chart   Glucose, capillary     Status: None   Collection Time: 10/08/17 11:18 AM  Result Value Ref Range   Glucose-Capillary 79 65 - 99 mg/dL     Ct Angio Head W Or Wo Contrast  Result Date: 10/07/2017 CLINICAL DATA:  Initial evaluation for acute expressive aphasia. EXAM: CT ANGIOGRAPHY HEAD AND NECK CT PERFUSION BRAIN TECHNIQUE: Multidetector CT imaging of the head and neck was performed using the standard protocol during bolus administration of intravenous contrast. Multiplanar CT image reconstructions and MIPs were obtained to evaluate the vascular anatomy. Carotid stenosis measurements (when applicable) are obtained utilizing NASCET criteria, using the distal internal carotid diameter as the denominator. Multiphase CT imaging of the brain was performed following IV bolus contrast injection. Subsequent parametric perfusion maps were calculated using RAPID software. CONTRAST:  ISOVUE-370 IOPAMIDOL (ISOVUE-370) INJECTION 76% COMPARISON:  Prior CT from 10/06/2017. FINDINGS: CTA NECK FINDINGS Aortic arch: Visualized aortic arch of normal caliber with normal 3 vessel morphology. No flow-limiting stenosis about the origin of the great vessels. Partially visualized subclavian arteries widely patent. Right carotid system: Right common and internal carotid arteries are patent without stenosis, dissection, or occlusion. No significant atheromatous narrowing about  the right carotid bifurcation. Left carotid system: Left common and internal carotid arteries are patent without stenosis, dissection, or occlusion. No significant atheromatous narrowing about the left carotid bifurcation. Vertebral arteries: Both of the vertebral arteries arise from the subclavian arteries. Vertebral arteries poorly evaluated proximally due to extensive adjacent venous collateralization. Vertebral arteries are grossly patent to the skull base without obvious stenosis. Left vertebral artery slightly dominant. Skeleton: No acute osseus  abnormality. No worrisome lytic or blastic osseous lesions. Mild multilevel degenerate spondylolysis noted within cervical spine. Other neck: No acute soft tissue abnormality within the neck. Endotracheal and enteric tubes in place. Upper chest: Visualized upper mediastinum within normal limits. Dependent atelectatic changes noted within the visualized lungs. Review of the MIP images confirms the above findings CTA HEAD FINDINGS Anterior circulation: Petrous, cavernous, and supraclinoid right ICA patent without flow-limiting stenosis. Right ICA terminus widely patent. Right M1 segment well perfused without stenosis. No proximal right M2 occlusion. Distal right MCA branches well perfused. Petrous and cavernous left ICA widely patent. There is a focal moderate stenosis at the supraclinoid left ICA (series 8, image 118). Left ICA terminus patent distally. Asymmetric atheromatous irregularity throughout the left M1 segment with mild to moderate multifocal narrowing. No M1 occlusion. Additional focal severe proximal left M2 stenosis (series 10, image 99). No proximal M2 occlusion. Distal left MCA branches perfused with demonstrate asymmetric atheromatous irregularity as compared to the contralateral right MCA. Overall, left MCA branches are attenuated as compared to the contralateral right. A1 segments patent bilaterally. Patent and normal anterior communicating artery.  Anterior cerebral artery is perfused to their distal aspects without high-grade stenosis. Posterior circulation: Dominant left vertebral artery patent to the vertebrobasilar junction without flow-limiting stenosis. Slightly diminutive right vertebral artery demonstrates scattered atheromatous irregularity without high-grade stenosis. Posterior inferior cerebral arteries patent bilaterally. Basilar patent to its distal aspect without stenosis. Superior cerebral arteries patent bilaterally. PCAs primarily supplied via the basilar and are patent to their distal aspects. Venous sinuses: Not well evaluated due to timing of the contrast bolus. Anatomic variants: None significant.  No appreciable aneurysm. Delayed phase: Not performed. Review of the MIP images confirms the above findings CT Brain Perfusion Findings: CBF (<30%) Volume: 0mL Perfusion (Tmax>6.0s) volume: 58mL Mismatch Volume: 58mL Infarction Location:No frank infarct by CT perfusion. There is delayed perfusion with perfusion mismatch involving the left cerebral hemisphere, primarily involving the posterior left MCA territory and/or MCA-PCA watershed territory, with additional perfusion abnormality within the deep white matter of the left cerebral hemisphere, left MCA watershed area. Findings suspected to be related to left supraclinoid stenosis with asymmetric atheromatous irregularity within the left MCA distribution. IMPRESSION: 1. Negative CTA for emergent large vessel occlusion. No acute infarct by CT perfusion. 2. 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 Left MCA branches are attenuated as compared to the contralateral right. 3. Delayed perfusion/perfusion mismatch within the left cerebral hemisphere, overall watershed in distribution, likely secondary to the above-mentioned stenoses and atheromatous disease. 4.  No other high-grade or correctable stenosis identified within the major arterial vasculature of the head and neck. Critical Value/emergent results were called by telephone at the time of interpretation on 10/07/2017 at 5:14 am to Dr. Otelia Limes, who verbally acknowledged these results. Electronically Signed   By: Rise Mu M.D.   On: 10/07/2017 05:38   Ct Angio Neck W Or Wo Contrast  Result Date: 10/07/2017 CLINICAL DATA:  Initial evaluation for acute expressive aphasia. EXAM: CT ANGIOGRAPHY HEAD AND NECK CT PERFUSION BRAIN TECHNIQUE: Multidetector CT imaging of the head and neck was performed using the standard protocol during bolus administration of intravenous contrast. Multiplanar CT image reconstructions and MIPs were obtained to evaluate the vascular anatomy. Carotid stenosis measurements (when applicable) are obtained utilizing NASCET criteria, using the distal internal carotid diameter as the denominator. Multiphase CT imaging of the brain  was performed following IV bolus contrast injection. Subsequent parametric perfusion maps were calculated using RAPID software. CONTRAST:  ISOVUE-370 IOPAMIDOL (ISOVUE-370) INJECTION 76% COMPARISON:  Prior CT from 10/06/2017. FINDINGS: CTA NECK FINDINGS Aortic arch: Visualized aortic arch of normal caliber with normal 3 vessel morphology. No flow-limiting stenosis about the origin of the great vessels. Partially visualized subclavian arteries widely patent. Right carotid system: Right common and internal carotid arteries are patent without stenosis, dissection, or occlusion. No significant atheromatous narrowing about the right carotid bifurcation. Left carotid system: Left common and internal carotid arteries are patent without stenosis, dissection, or occlusion. No significant atheromatous narrowing about the left carotid bifurcation. Vertebral arteries: Both of the vertebral arteries arise from the subclavian arteries. Vertebral arteries poorly  evaluated proximally due to extensive adjacent venous collateralization. Vertebral arteries are grossly patent to the skull base without obvious stenosis. Left vertebral artery slightly dominant. Skeleton: No acute osseus abnormality. No worrisome lytic or blastic osseous lesions. Mild multilevel degenerate spondylolysis noted within cervical spine. Other neck: No acute soft tissue abnormality within the neck. Endotracheal and enteric tubes in place. Upper chest: Visualized upper mediastinum within normal limits. Dependent atelectatic changes noted within the visualized lungs. Review of the MIP images confirms the above findings CTA HEAD FINDINGS Anterior circulation: Petrous, cavernous, and supraclinoid right ICA patent without flow-limiting stenosis. Right ICA terminus widely patent. Right M1 segment well perfused without stenosis. No proximal right M2 occlusion. Distal right MCA branches well perfused. Petrous and cavernous left ICA widely patent. There is a focal moderate stenosis at the supraclinoid left ICA (series 8, image 118). Left ICA terminus patent distally. Asymmetric atheromatous irregularity throughout the left M1 segment with mild to moderate multifocal narrowing. No M1 occlusion. Additional focal severe proximal left M2 stenosis (series 10, image 99). No proximal M2 occlusion. Distal left MCA branches perfused with demonstrate asymmetric atheromatous irregularity as compared to the contralateral right MCA. Overall, left MCA branches are attenuated as compared to the contralateral right. A1 segments patent bilaterally. Patent and normal anterior communicating artery. Anterior cerebral artery is perfused to their distal aspects without high-grade stenosis. Posterior circulation: Dominant left vertebral artery patent to the vertebrobasilar junction without flow-limiting stenosis. Slightly diminutive right vertebral artery demonstrates scattered atheromatous irregularity without high-grade stenosis.  Posterior inferior cerebral arteries patent bilaterally. Basilar patent to its distal aspect without stenosis. Superior cerebral arteries patent bilaterally. PCAs primarily supplied via the basilar and are patent to their distal aspects. Venous sinuses: Not well evaluated due to timing of the contrast bolus. Anatomic variants: None significant.  No appreciable aneurysm. Delayed phase: Not performed. Review of the MIP images confirms the above findings CT Brain Perfusion Findings: CBF (<30%) Volume: 0mL Perfusion (Tmax>6.0s) volume: 58mL Mismatch Volume: 58mL Infarction Location:No frank infarct by CT perfusion. There is delayed perfusion with perfusion mismatch involving the left cerebral hemisphere, primarily involving the posterior left MCA territory and/or MCA-PCA watershed territory, with additional perfusion abnormality within the deep white matter of the left cerebral hemisphere, left MCA watershed area. Findings suspected to be related to left supraclinoid stenosis with asymmetric atheromatous irregularity within the left MCA distribution. IMPRESSION: 1. Negative CTA for emergent large vessel occlusion. No acute infarct by CT perfusion. 2. 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 Left MCA branches are attenuated as compared to the contralateral right. 3. Delayed perfusion/perfusion mismatch within the left cerebral hemisphere, overall watershed in  distribution, likely secondary to the above-mentioned stenoses and atheromatous disease. 4. No other high-grade or correctable stenosis identified within the major arterial vasculature of the head and neck. Critical Value/emergent results were called by telephone at the time of interpretation on 10/07/2017 at 5:14 am to Dr. Otelia Limes, who verbally acknowledged these results. Electronically Signed   By: Rise Mu  M.D.   On: 10/07/2017 05:38   Mr Brain Wo Contrast  Result Date: 10/07/2017 CLINICAL DATA:  Stroke follow-up EXAM: MRI HEAD WITHOUT CONTRAST TECHNIQUE: Multiplanar, multiecho pulse sequences of the brain and surrounding structures were obtained without intravenous contrast. COMPARISON:  CT and CTA from yesterday and earlier today. FINDINGS: Brain: No acute infarction, hemorrhage, hydrocephalus, extra-axial collection or mass lesion. Mild periventricular FLAIR hyperintensity is nonspecific but usually chronic small vessel ischemia. Probable lacune at the left caudate nucleus. Vascular: Major flow voids are preserved Skull and upper cervical spine: No evidence of marrow lesion Sinuses/Orbits: Remote blowout fracture of the medial wall left orbit. Minor mucosal thickening in the paranasal sinuses. Other: Motion degraded. IMPRESSION: 1. No acute finding including infarct. 2. Mild chronic small vessel ischemia. 3. Motion degraded. Electronically Signed   By: Marnee Spring M.D.   On: 10/07/2017 17:48   Ct Cerebral Perfusion W Contrast  Result Date: 10/07/2017 CLINICAL DATA:  Initial evaluation for acute expressive aphasia. EXAM: CT ANGIOGRAPHY HEAD AND NECK CT PERFUSION BRAIN TECHNIQUE: Multidetector CT imaging of the head and neck was performed using the standard protocol during bolus administration of intravenous contrast. Multiplanar CT image reconstructions and MIPs were obtained to evaluate the vascular anatomy. Carotid stenosis measurements (when applicable) are obtained utilizing NASCET criteria, using the distal internal carotid diameter as the denominator. Multiphase CT imaging of the brain was performed following IV bolus contrast injection. Subsequent parametric perfusion maps were calculated using RAPID software. CONTRAST:  ISOVUE-370 IOPAMIDOL (ISOVUE-370) INJECTION 76% COMPARISON:  Prior CT from 10/06/2017. FINDINGS: CTA NECK FINDINGS Aortic arch: Visualized aortic arch of normal caliber with  normal 3 vessel morphology. No flow-limiting stenosis about the origin of the great vessels. Partially visualized subclavian arteries widely patent. Right carotid system: Right common and internal carotid arteries are patent without stenosis, dissection, or occlusion. No significant atheromatous narrowing about the right carotid bifurcation. Left carotid system: Left common and internal carotid arteries are patent without stenosis, dissection, or occlusion. No significant atheromatous narrowing about the left carotid bifurcation. Vertebral arteries: Both of the vertebral arteries arise from the subclavian arteries. Vertebral arteries poorly evaluated proximally due to extensive adjacent venous collateralization. Vertebral arteries are grossly patent to the skull base without obvious stenosis. Left vertebral artery slightly dominant. Skeleton: No acute osseus abnormality. No worrisome lytic or blastic osseous lesions. Mild multilevel degenerate spondylolysis noted within cervical spine. Other neck: No acute soft tissue abnormality within the neck. Endotracheal and enteric tubes in place. Upper chest: Visualized upper mediastinum within normal limits. Dependent atelectatic changes noted within the visualized lungs. Review of the MIP images confirms the above findings CTA HEAD FINDINGS Anterior circulation: Petrous, cavernous, and supraclinoid right ICA patent without flow-limiting stenosis. Right ICA terminus widely patent. Right M1 segment well perfused without stenosis. No proximal right M2 occlusion. Distal right MCA branches well perfused. Petrous and cavernous left ICA widely patent. There is a focal moderate stenosis at the supraclinoid left ICA (series 8, image 118). Left ICA terminus patent distally. Asymmetric atheromatous irregularity throughout the left M1 segment with mild to moderate multifocal narrowing. No M1 occlusion. Additional focal severe proximal  left M2 stenosis (series 10, image 99). No proximal  M2 occlusion. Distal left MCA branches perfused with demonstrate asymmetric atheromatous irregularity as compared to the contralateral right MCA. Overall, left MCA branches are attenuated as compared to the contralateral right. A1 segments patent bilaterally. Patent and normal anterior communicating artery. Anterior cerebral artery is perfused to their distal aspects without high-grade stenosis. Posterior circulation: Dominant left vertebral artery patent to the vertebrobasilar junction without flow-limiting stenosis. Slightly diminutive right vertebral artery demonstrates scattered atheromatous irregularity without high-grade stenosis. Posterior inferior cerebral arteries patent bilaterally. Basilar patent to its distal aspect without stenosis. Superior cerebral arteries patent bilaterally. PCAs primarily supplied via the basilar and are patent to their distal aspects. Venous sinuses: Not well evaluated due to timing of the contrast bolus. Anatomic variants: None significant.  No appreciable aneurysm. Delayed phase: Not performed. Review of the MIP images confirms the above findings CT Brain Perfusion Findings: CBF (<30%) Volume: 0mL Perfusion (Tmax>6.0s) volume: 58mL Mismatch Volume: 58mL Infarction Location:No frank infarct by CT perfusion. There is delayed perfusion with perfusion mismatch involving the left cerebral hemisphere, primarily involving the posterior left MCA territory and/or MCA-PCA watershed territory, with additional perfusion abnormality within the deep white matter of the left cerebral hemisphere, left MCA watershed area. Findings suspected to be related to left supraclinoid stenosis with asymmetric atheromatous irregularity within the left MCA distribution. IMPRESSION: 1. Negative CTA for emergent large vessel occlusion. No acute infarct by CT perfusion. 2. 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 Left MCA branches are attenuated as compared to the contralateral right. 3. Delayed perfusion/perfusion mismatch within the left cerebral hemisphere, overall watershed in distribution, likely secondary to the above-mentioned stenoses and atheromatous disease. 4. No other high-grade or correctable stenosis identified within the major arterial vasculature of the head and neck. Critical Value/emergent results were called by telephone at the time of interpretation on 10/07/2017 at 5:14 am to Dr. Otelia Limes, who verbally acknowledged these results. Electronically Signed   By: Rise Mu M.D.   On: 10/07/2017 05:38   Dg Chest Portable 1 View  Result Date: 10/07/2017 CLINICAL DATA:  Intubation.  OG insertion. EXAM: PORTABLE CHEST 1 VIEW COMPARISON:  None. FINDINGS: Endotracheal tube placed with tip measuring 4.1 cm above the carina. Enteric tube was placed. Tip is off the field of view but below the left hemidiaphragm. Proximal side hole is at or just below the EG junction region. Shallow inspiration. Cardiac enlargement. Probable vascular congestion. No edema or consolidation. Atelectasis in the lung bases. No blunting of costophrenic angles. No pneumothorax. IMPRESSION: Appliances appear in satisfactory position. Shallow inspiration with atelectasis in the lung bases. Cardiac enlargement with vascular congestion. No edema or consolidation. Electronically Signed   By: Burman Nieves M.D.   On: 10/07/2017 03:38   Ct Head Code Stroke Wo Contrast`  Result Date: 10/06/2017 CLINICAL DATA:  Code stroke. Initial evaluation for acute altered mental status. EXAM: CT HEAD WITHOUT CONTRAST TECHNIQUE: Contiguous axial images were obtained from the base of the skull through the vertex without intravenous contrast. COMPARISON:  None available. FINDINGS: Brain: Generalized age-related cerebral atrophy with mild chronic small vessel ischemic disease. Small remote lacunar  infarct noted within the left caudate. No acute intracranial hemorrhage. No definite acute large vessel territory infarct. No mass lesion, midline shift or mass effect. No hydrocephalus. No extra-axial fluid collection. Vascular: No asymmetric hyperdense vessel. Scattered vascular calcifications  noted within the carotid siphons. Skull: Scalp soft tissues and calvarium within normal limits. Sinuses/Orbits: Globes and orbital soft tissues demonstrate no acute abnormality. Remote posttraumatic defect noted at the left lamina for pre shift. Changes related chronic maxillary sinusitis noted. Retention cysts noted within left sphenoid sinus. No mastoid effusion. Other: None. ASPECTS [facility] Stroke Program Early CT Score) - Ganglionic level infarction (caudate, lentiform nuclei, internal capsule, insula, M1-M3 cortex): 7 - Supraganglionic infarction (M4-M6 cortex): 3 Total score (0-10 with 10 being normal): 10 IMPRESSION: 1. No acute intracranial infarct or other abnormality identified. 2. ASPECTS is 10. 3. Atrophy with mild chronic small vessel ischemic disease with small remote left caudate lacunar infarct. These results were communicated to [place] at 11:42 pmon 4/27/2019by text page via the [facility] messaging system. Electronically Signed   By: Rise Mu M.D.   On: 10/06/2017 23:47    Physical Exam:   Obese middle-aged African-American male currently not in distress. . Afebrile. Head is nontraumatic. Neck is supple without bruit.    Cardiac exam no murmur or gallop. Lungs are clear to auscultation. Distal pulses are well felt. Neurological Exam ;  Awake  Alert oriented x 3. Normal speech and language.eye movements full without nystagmus.fundi were not visualized. Vision acuity and fields appear normal. Hearing is normal. Palatal movements are normal. Face symmetric. Tongue midline. Normal strength, tone, reflexes and coordination. Normal sensation. Gait deferred. ASSESSMENT [name] is a 57  y.o. male with  Transient episode of altered mental status, nausea, dizziness, vomiting and dysarthria in the setting of possibly dehydration and son stroke. Brain imaging studies as well as EEG are unremarkable. Hospital day # 1  TREATMENT/PLAN  continue mobilize out of bed and transferred to the floor bed.Monitor for: alcohol withdrawal.check echocardiogram. Aspirin 81 mg daily for stroke prevention..  Discussed with Dr. Warren Lacy primary critical care medicine. No further stroke workup is necessary at the present time. Greater than 50% time during this 25 minute visit was spent on counseling and coordination of care about his transient episode and answering questions Delia Heady, MD [facility] Pager: [phone] 10/08/2017 2:40 PM

## 2017-10-08 NOTE — Progress Notes (Signed)
Bedside EEG completed, results pending. 

## 2017-10-09 ENCOUNTER — Encounter (HOSPITAL_COMMUNITY): Payer: Self-pay

## 2017-10-09 ENCOUNTER — Other Ambulatory Visit: Payer: Self-pay

## 2017-10-09 ENCOUNTER — Other Ambulatory Visit (HOSPITAL_COMMUNITY): Payer: Medicaid Other

## 2017-10-09 DIAGNOSIS — G9340 Encephalopathy, unspecified: Secondary | ICD-10-CM

## 2017-10-09 LAB — GLUCOSE, CAPILLARY
GLUCOSE-CAPILLARY: 83 mg/dL (ref 65–99)
GLUCOSE-CAPILLARY: 81 mg/dL (ref 65–99)
GLUCOSE-CAPILLARY: 88 mg/dL (ref 65–99)
Glucose-Capillary: 88 mg/dL (ref 65–99)

## 2017-10-09 LAB — VITAMIN B12: Vitamin B-12: 286 pg/mL (ref 180–914)

## 2017-10-09 LAB — FOLATE: FOLATE: 7.3 ng/mL (ref >5.9)

## 2017-10-09 MED ORDER — ASPIRIN 81 MG PO TBEC: 81.0000 mg | DELAYED_RELEASE_TABLET | Freq: Every day | ORAL | Status: AC

## 2017-10-09 MED ORDER — IBUPROFEN 400 MG PO TABS
400.0000 mg | ORAL_TABLET | Freq: Four times a day (QID) | ORAL | Status: DC | PRN
  Administered 2017-10-09: 400 mg via ORAL
  Filled 2017-10-09: qty 1

## 2017-10-09 MED ORDER — FUROSEMIDE 20 MG PO TABS: 20.0000 mg | ORAL_TABLET | Freq: Every day | ORAL | Status: AC

## 2017-10-09 MED ORDER — DEXAMETHASONE SODIUM PHOSPHATE 10 MG/ML IJ SOLN
6.0000 mg | Freq: Once | INTRAMUSCULAR | Status: AC
  Administered 2017-10-09: 6 mg via INTRAVENOUS
  Filled 2017-10-09: qty 1

## 2017-10-09 MED ORDER — ATORVASTATIN CALCIUM 80 MG PO TABS: 80.0000 mg | ORAL_TABLET | Freq: Every day | ORAL | 0 refills | Status: AC

## 2017-10-09 NOTE — Progress Notes (Signed)
Stroke Team Progress Note       SUBJECTIVE He is  doing better and feels his back to his baseline and wants to go home.  OBJECTIVE Most recent Vital Signs: Temp: 98.7 F (37.1 C) (04/30 1333) Temp Source: Oral (04/30 1333) BP: 150/97 (04/30 1333) Pulse Rate: 101 (04/30 1333) Respiratory Rate: 20 O2 Saturdation: 97%  CBG (last 3)  Recent Labs    10/09/17 0513 10/09/17 0749 10/09/17 1201  GLUCAP 83 81 88    Diet:  Diet Order           Diet - low sodium heart healthy        Diet regular Room service appropriate? Yes; Fluid consistency: Thin  Diet effective now           liquids  Activity: Up with assistance      Studies: Results for orders placed or performed during the hospital encounter of 10/06/17 (from the past 24 hour(s))  Glucose, capillary     Status: Abnormal   Collection Time: 10/08/17  3:28 PM  Result Value Ref Range   Glucose-Capillary 118 (H) 65 - 99 mg/dL   Comment 1 Notify RN    Comment 2 Document in Chart   Glucose, capillary     Status: Abnormal   Collection Time: 10/08/17  9:29 PM  Result Value Ref Range   Glucose-Capillary 121 (H) 65 - 99 mg/dL  Glucose, capillary     Status: None   Collection Time: 10/09/17 12:54 AM  Result Value Ref Range   Glucose-Capillary 88 65 - 99 mg/dL  Vitamin U98     Status: None   Collection Time: 10/09/17  3:57 AM  Result Value Ref Range   Vitamin B-12 286 180 - 914 pg/mL  Folate     Status: None   Collection Time: 10/09/17  3:57 AM  Result Value Ref Range   Folate 7.3 >5.9 ng/mL  Glucose, capillary     Status: None   Collection Time: 10/09/17  5:13 AM  Result Value Ref Range   Glucose-Capillary 83 65 - 99 mg/dL  Glucose, capillary     Status: None   Collection Time: 10/09/17  7:49 AM  Result Value Ref Range   Glucose-Capillary 81 65 - 99 mg/dL  Glucose, capillary     Status: None   Collection Time: 10/09/17 12:01 PM  Result Value Ref Range   Glucose-Capillary 88 65 - 99 mg/dL     Mr Brain Wo  Contrast  Result Date: 10/07/2017 CLINICAL DATA:  Stroke follow-up EXAM: MRI HEAD WITHOUT CONTRAST TECHNIQUE: Multiplanar, multiecho pulse sequences of the brain and surrounding structures were obtained without intravenous contrast. COMPARISON:  CT and CTA from yesterday and earlier today. FINDINGS: Brain: No acute infarction, hemorrhage, hydrocephalus, extra-axial collection or mass lesion. Mild periventricular FLAIR hyperintensity is nonspecific but usually chronic small vessel ischemia. Probable lacune at the left caudate nucleus. Vascular: Major flow voids are preserved Skull and upper cervical spine: No evidence of marrow lesion Sinuses/Orbits: Remote blowout fracture of the medial wall left orbit. Minor mucosal thickening in the paranasal sinuses. Other: Motion degraded. IMPRESSION: 1. No acute finding including infarct. 2. Mild chronic small vessel ischemia. 3. Motion degraded. Electronically Signed   By: Marnee Spring M.D.   On: 10/07/2017 17:48    Physical Exam:   Obese middle-aged African-American male currently not in distress. . Afebrile. Head is nontraumatic. Neck is supple without bruit.    Cardiac exam no murmur or gallop. Lungs are clear  to auscultation. Distal pulses are well felt. Neurological Exam ;  Awake  Alert oriented x 3. Normal speech and language.eye movements full without nystagmus.fundi were not visualized. Vision acuity and fields appear normal. Hearing is normal. Palatal movements are normal. Face symmetric. Tongue midline. Normal strength, tone, reflexes and coordination. Normal sensation. Gait deferred. ASSESSMENT [name] is a 57 y.o. male with  Transient episode of altered mental status, nausea, dizziness, vomiting and dysarthria in the setting of possibly dehydration and son stroke. Brain imaging studies as well as EEG are unremarkable. Hospital day # 2  TREATMENT/PLAN  DC home when medically stable. Aspirin 81 mg daily for stroke prevention..    No  further stroke workup is necessary at the present time  Delia Heady, MD [facility] Pager: [phone] 10/09/2017 2:25 PM

## 2017-10-09 NOTE — Discharge Instructions (Signed)
1.  Follow up with your primary care MD regarding you blood pressure.  You should resume your prior blood pressure medications as instructed.  You will need a follow up echocardiogram with your primary MD.   2.  Review your medications carefully. Do not take any lasix until seen by your primary doctor.   3.  Return to the ER immediately if new or worsening symptoms.

## 2017-10-09 NOTE — Progress Notes (Signed)
eLink Physician-Brief Progress Note Patient Name: Meko Masterson DOB: 1961-05-09 MRN: [id number]   Date of Service  10/09/2017  HPI/Events of Note  Patient c/o elbow pain. Creatinine = 0.78.  eICU Interventions  Will order: 1. Motrin 400 mg PO Q 6 hours PRN pain.      Intervention Category Intermediate Interventions: Pain - evaluation and management  Keeshawn Fakhouri [place] 10/09/2017, 1:15 AM

## 2017-10-09 NOTE — Discharge Summary (Signed)
Physician Discharge Summary  Patient ID: Phillip Rivas MRN: [id number] DOB/AGE: 1961/03/21 57 y.o.  Admit date: 10/06/2017 Discharge date: 10/09/2017    Discharge Diagnoses:  Acute metabolic encephalopathy Acute respiratory insufficiency Hypertension LVH on EKG Gout                                                                     DISCHARGE PLAN BY DIAGNOSIS      Acute metabolic encephalopathy  Discharge Plan: Work up negative for stroke Continue ASA 81 mg QD   Acute respiratory insufficiency  Discharge Plan: Resolved.  No acute follow up necessary.   Hypertension LVH on EKG  Discharge Plan: Patient reportedly takes antihypertensive at home but is unable to recall name.  He is instructed to resume his antihypertensives as previously prescribed and follow-up with his PCP regarding echocardiogram.    Gout                   Discharge Plan: Resume home allopurinol Follow-up with PCP                     DISCHARGE SUMMARY    57 year old male with a past medical history of hypertension, gout and alcohol use (less than a 6 pack/day) who presented to [facility] on 4/27 after being found stumbling, confused and vomiting.  Stroke was suspected and EMS administered 324 mg of aspirin in route.  On arrival, the patient was alert but confused and dysarthric.  He was unable to follow commands.  A code stroke was initiated.  Urine drug screen was negative.  During CT imaging the patient became acutely agitated requiring Haldol and Ativan.  Unfortunately he ultimately required intubation.  PCCM consulted for admission.  Neurology consulted for stroke evaluation.  CT of the head was negative.  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  The patient was treated with Precedex while on mechanical ventilation.  On the a.m. of 4/28 the patient was alert and  appropriate.  He met criteria for extubation.  Post extubation, the patient reported it was very hot at the races and he was dizzy.  He states he only drank 1 beer.  He attempted to drink water but he vomited.  He denied any known ingestion of drugs.  Labs were negative for rhabdomyolysis/acute kidney injury.  No further stroke work-up recommended per neurology.  EEG was negative for seizure activity.  MRI of the brain negative for acute stroke.  Ultimately was felt that his behavior was related to dehydration and alcohol use.  In addition, he also had LVH pattern noted on EKG and is recommended for outpatient echo.  He was transferred out of the ICU to the medical floor.  On 4/30 the patient was deemed medically appropriate for discharge with plans as above.  Is noted during hospitalization that he had a flare of gout in his left elbow.  He was treated with IV Decadron.            SIGNIFICANT DIAGNOSTIC STUDIES UDS 4/27 >> negative  CONSULTS Neurology  TUBES / LINES ETT 4/27 >> 4/28   Discharge Exam: General: Adult male in no acute  distress sitting up in chair watching TV Neuro: AAO x4, speech clear, moving all extremities CV: S1-S2 RRR, no murmurs rubs or gallops PULM: Even/nonlabored on room air, lungs bilaterally clear GI: Abdomen soft/nontender to palpation Extremities: Warm and dry, gout flare in left elbow  Vitals:   10/08/17 2131 10/08/17 2219 10/09/17 0508 10/09/17 0800  BP: (!) 147/87  (!) 166/116 (!) 145/91  Pulse: 96  87 86  Resp: 18  18   Temp: 99.2 F (37.3 C)  98.5 F (36.9 C)   TempSrc: Oral  Oral   SpO2: 98% 95% 97%   Weight:      Height:         Discharge Labs  BMET Recent Labs  Lab 10/06/17 1945 10/07/17 0202 10/07/17 0404 10/08/17 0635  NA 135 135  --  139  K 4.1 4.1  --  3.8  CL 100* 101  --  111  CO2 24 25  --  22  GLUCOSE 121* 128*  --  92  BUN 11 10  --  10  CREATININE 0.93 0.95  --  0.78  CALCIUM 9.8 9.2  --  9.1  MG  --   --  1.8 1.8   PHOS  --   --  2.8 3.3    CBC Recent Labs  Lab 10/06/17 1945 10/07/17 0202 10/08/17 0635  HGB 14.1 12.8* 13.1  HCT 41.4 38.6* 39.7  WBC 4.3 4.5 7.9  PLT 198 171 156    Anti-Coagulation No results for input(s): INR in the last 168 hours.  Discharge Instructions    Call MD for:  difficulty breathing, headache or visual disturbances   Complete by:  As directed    Call MD for:  extreme fatigue   Complete by:  As directed    Call MD for:  hives   Complete by:  As directed    Call MD for:  persistant dizziness or light-headedness   Complete by:  As directed    Call MD for:  persistant nausea and vomiting   Complete by:  As directed    Call MD for:  severe uncontrolled pain   Complete by:  As directed    Call MD for:  temperature >100.4   Complete by:  As directed    Diet - low sodium heart healthy   Complete by:  As directed    Increase activity slowly   Complete by:  As directed        Follow-up [place]. Call.   Why:  Call to be seen wihtin one week.  You will need a follow up ultrasound of your heart and blood pressure control.           Allergies as of 10/09/2017   No Known Allergies     Medication List    TAKE these medications   allopurinol 100 MG tablet Commonly known as:  ZYLOPRIM Take 100 mg by mouth 2 (two) times daily.   aspirin 81 MG EC tablet Take 1 tablet (81 mg total) by mouth daily. Start taking on:  10/10/2017   atorvastatin 80 MG tablet Commonly known as:  LIPITOR Take 1 tablet (80 mg total) by mouth daily at 6 PM.   furosemide 20 MG tablet Commonly known as:  LASIX Take 1-2 tablets (20-40 mg total) by mouth daily. Start taking on:  10/12/2017 What changed:  These instructions start on 10/12/2017. If you are unsure what to do until then, ask your doctor  or other care provider.         Disposition:  Home.  No new home health needs identified at time of discharge.   Discharged Condition: Phillip Rivas has  met maximum benefit of inpatient care and is medically stable and cleared for discharge.  Patient is pending follow up as above.      Time spent on disposition:  30 Minutes.   Signed: Noe Gens, NP-C [facility] Pgr: [phone] Office: [phone]

## 2017-10-09 NOTE — Progress Notes (Signed)
Informed Dr. Wallace Cullens of PCCM that patient is complaining of gout Pain in his left elbow. Informed MD that patient had Motrin last night but it did not help. MD ordered  IV decadron once for gout pain.

## 2017-10-09 NOTE — Progress Notes (Signed)
Nsg Discharge Note  Admit Date:  10/06/2017 Discharge date: 10/09/2017   Phillip Rivas to be D/C'd Home per MD order.  AVS completed.  Copy for chart, and copy for patient signed, and dated. Patient/caregiver able to verbalize understanding.  Discharge Medication: Allergies as of 10/09/2017   No Known Allergies     Medication List    TAKE these medications   allopurinol 100 MG tablet Commonly known as:  ZYLOPRIM Take 100 mg by mouth 2 (two) times daily.   aspirin 81 MG EC tablet Take 1 tablet (81 mg total) by mouth daily. Start taking on:  10/10/2017   atorvastatin 80 MG tablet Commonly known as:  LIPITOR Take 1 tablet (80 mg total) by mouth daily at 6 PM.   furosemide 20 MG tablet Commonly known as:  LASIX Take 1-2 tablets (20-40 mg total) by mouth daily. Start taking on:  10/12/2017 What changed:  These instructions start on 10/12/2017. If you are unsure what to do until then, ask your doctor or other care provider.       Discharge Assessment: Vitals:   10/09/17 0800 10/09/17 1333  BP: (!) 145/91 (!) 150/97  Pulse: 86 (!) 101  Resp:  20  Temp:  98.7 F (37.1 C)  SpO2:  97%   Skin clean, dry and intact without evidence of skin break down, no evidence of skin tears noted. IV catheter discontinued intact. Site without signs and symptoms of complications - no redness or edema noted at insertion site, patient denies c/o pain - only slight tenderness at site.  Dressing with slight pressure applied.  D/c Instructions-Education: Discharge instructions given to patient/family with verbalized understanding. D/c education completed with patient/family including follow up instructions, medication list, d/c activities limitations if indicated, with other d/c instructions as indicated by MD - patient able to verbalize understanding, all questions fully answered. Patient instructed to return to ED, call 911, or call MD for any changes in condition.  Patient escorted via WC, and D/C  home via private auto.  Phillip N Travious Vanover, RN 10/09/2017 1:46 PM

## 2017-10-12 LAB — CULTURE, BLOOD (ROUTINE X 2)
CULTURE: NO GROWTH
Culture: NO GROWTH
Special Requests: ADEQUATE

## 2018-11-06 IMAGING — DX DG CHEST 1V PORT
1 series · 1 of 1 positions shown · non-contrast
Comparison: None.

CLINICAL DATA: Intubation.  OG insertion.

EXAM:
PORTABLE CHEST 1 VIEW

[chest ap]
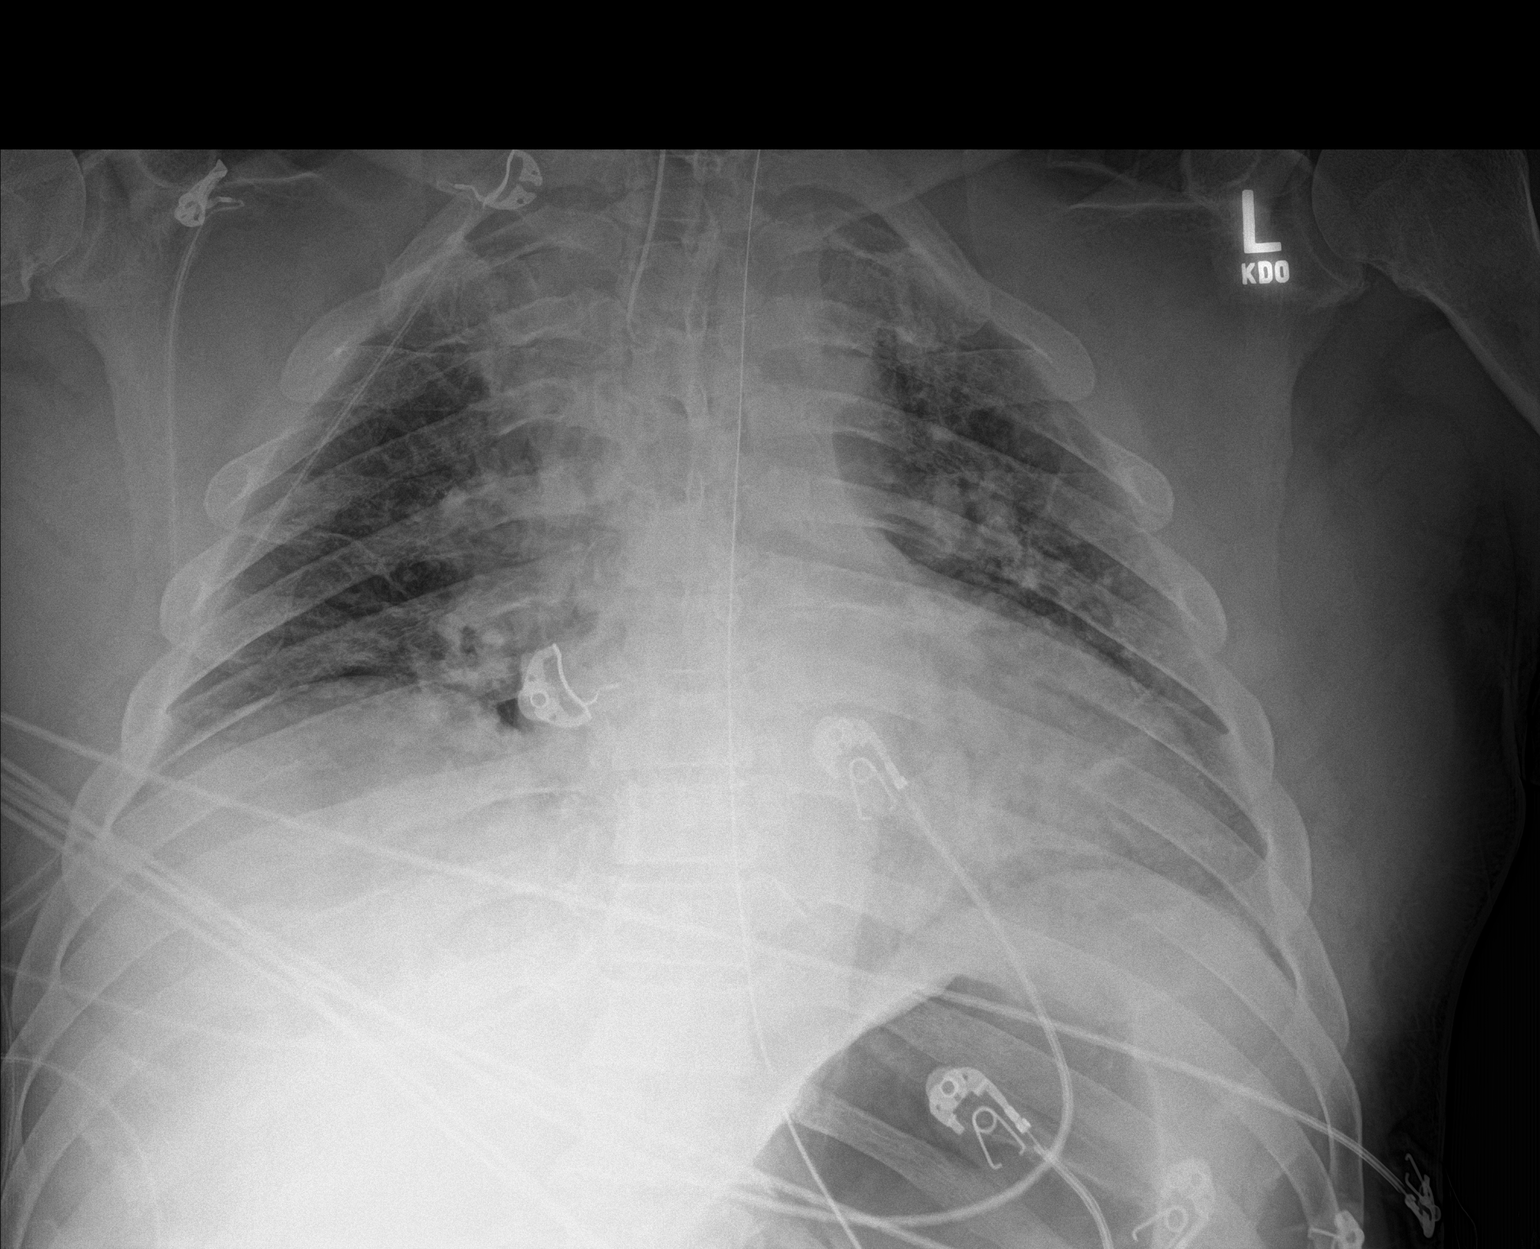

[1 of 1 positions shown; findings below may reference images not displayed]

FINDINGS: Endotracheal tube placed with tip measuring 4.1 cm above the carina.
Enteric tube was placed. Tip is off the field of view but below the
left hemidiaphragm. Proximal side hole is at or just below the EG
junction region.

Shallow inspiration. Cardiac enlargement. Probable vascular
congestion. No edema or consolidation. Atelectasis in the lung
bases. No blunting of costophrenic angles. No pneumothorax.
IMPRESSION: Appliances appear in satisfactory position. Shallow inspiration with
atelectasis in the lung bases. Cardiac enlargement with vascular
congestion. No edema or consolidation.

## 2018-11-06 IMAGING — MR MR HEAD W/O CM
9 of 10 series · 36 of 48 positions shown · non-contrast
Comparison: CT and CTA from yesterday and earlier today.

CLINICAL DATA: Stroke follow-up

EXAM:
MRI HEAD WITHOUT CONTRAST
TECHNIQUE: Multiplanar, multiecho pulse sequences of the brain and surrounding
structures were obtained without intravenous contrast.

[Series 3: DWI · axial · 3.0mm · 0.94mm/px · z∈[-136,+11]mm · 8 of 100 slices shown (1 of 2)]
[im 1/100]
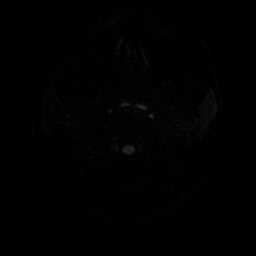
[im 12/100]
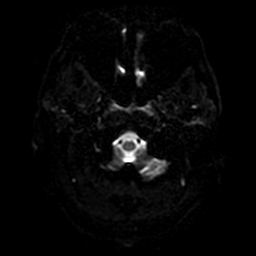
[im 34/100]
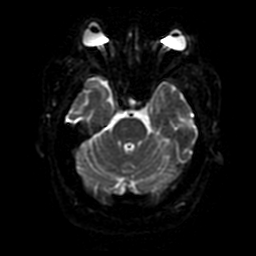
[im 45/100]
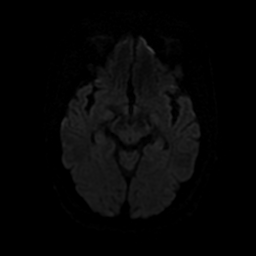
[im 56/100]
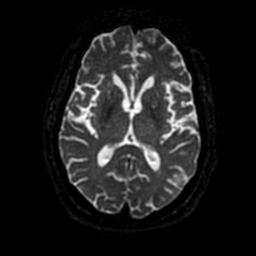
[im 67/100]
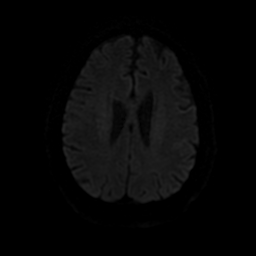
[im 89/100]
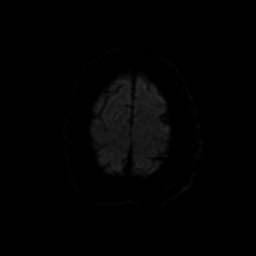
[im 100/100]
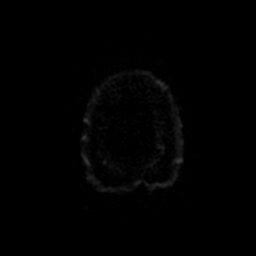

[Series 4: DWI · coronal · 4.0mm · 0.94mm/px · 7 of 78 slices shown (2 of 2)]
[im 1/78]
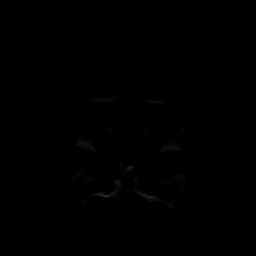
[im 13/78]
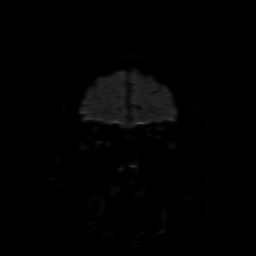
[im 26/78]
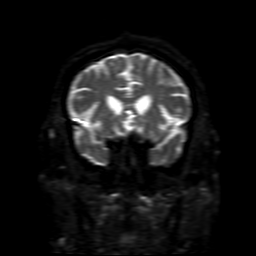
[im 39/78]
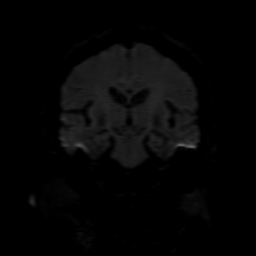
[im 52/78]
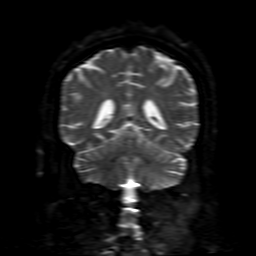
[im 65/78]
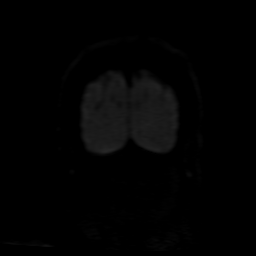
[im 78/78]
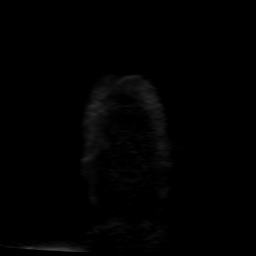

[Series 5: FLAIR · sagittal · 5.0mm · 0.47mm/px · 2 of 23 slices shown (1 of 2)]
[im 1/23]
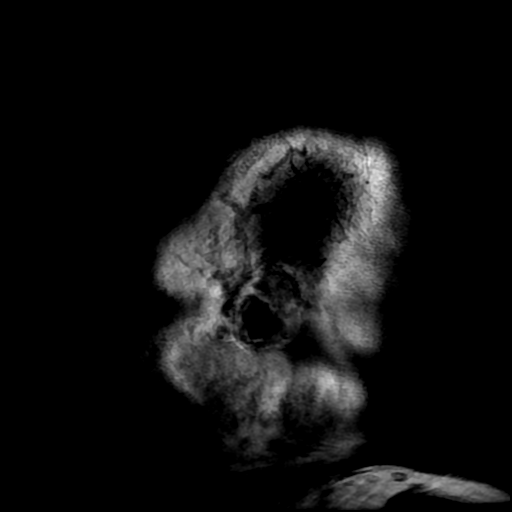
[im 23/23]
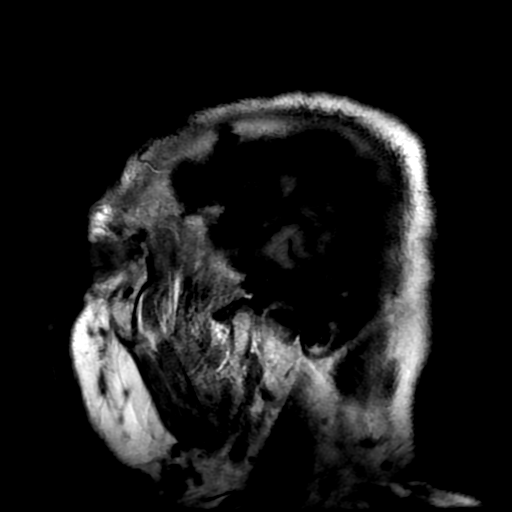

[Series 7: T2 · axial · 5.0mm · 0.47mm/px · z∈[-134,+10]mm · 2 of 25 slices shown (1 of 2)]
[im 1/25]
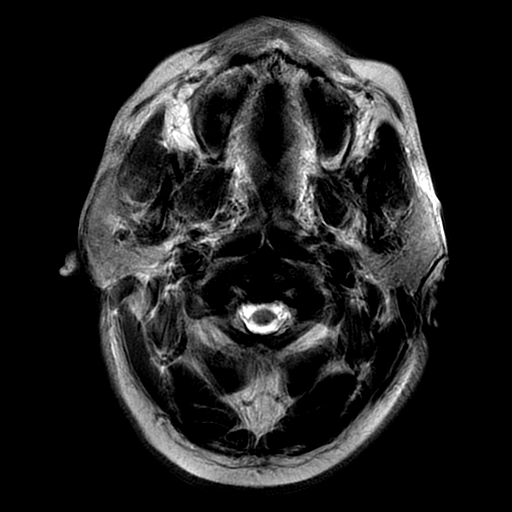
[im 25/25]
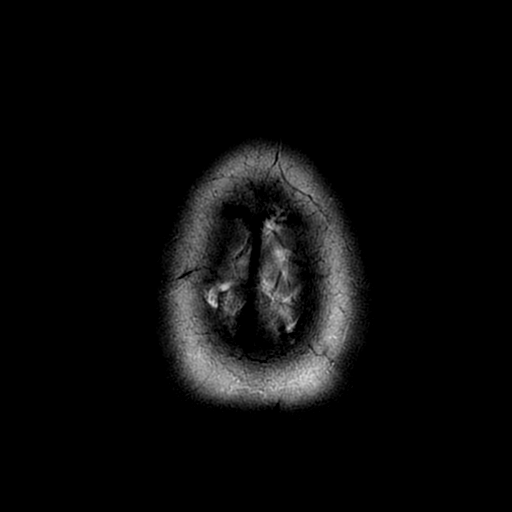

[Series 8: (person_name) · axial · 3.0mm · 0.47mm/px · z∈[-136,-100]mm · 3 of 100 slices shown]
[im 1/100]
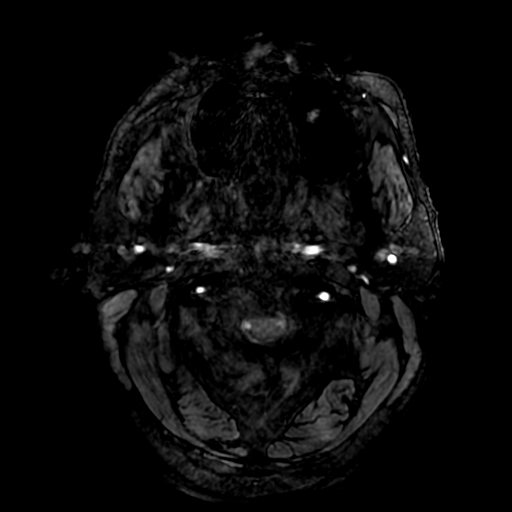
[im 13/100]
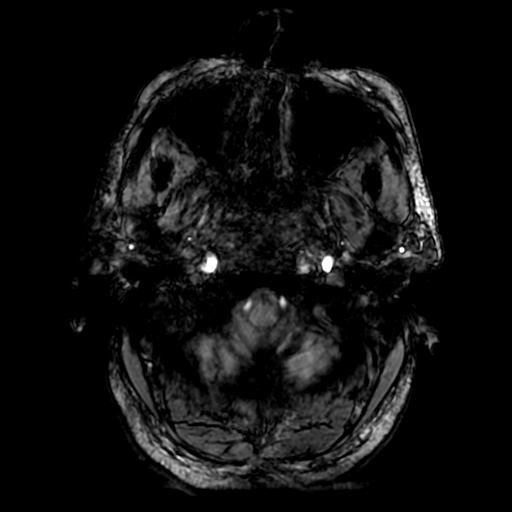
[im 25/100]
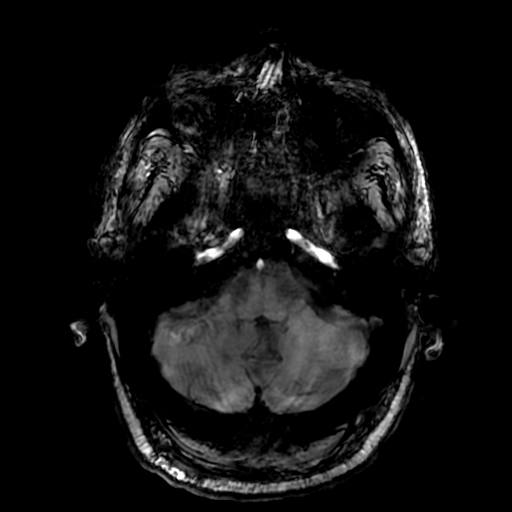

[Series 9: FLAIR · axial · 3.0mm · 0.47mm/px · z∈[-134,+10]mm · 2 of 25 slices shown (2 of 2)]
[im 1/25]
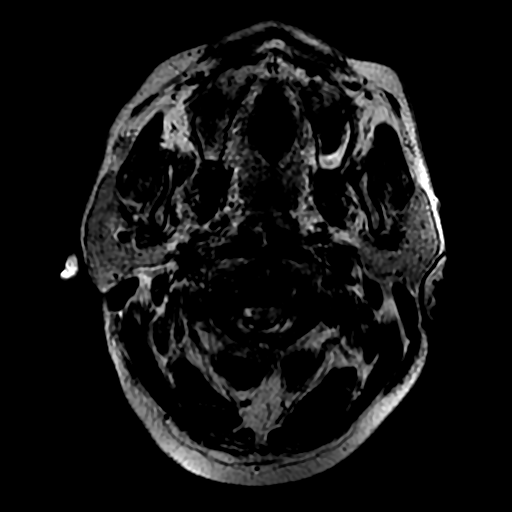
[im 25/25]
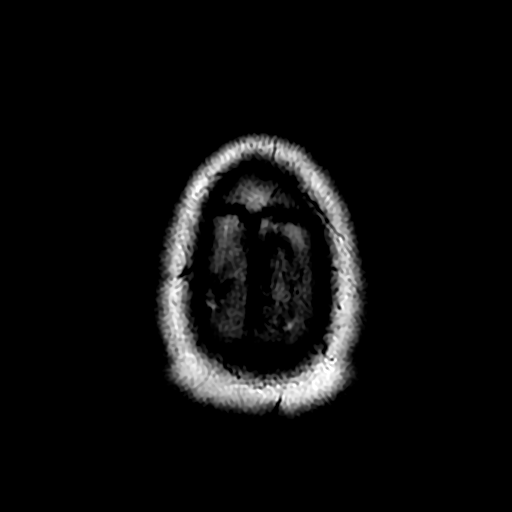

[Series 11: T2 · coronal · 5.0mm · 0.39mm/px · 3 of 27 slices shown (2 of 2)]
[im 1/27]
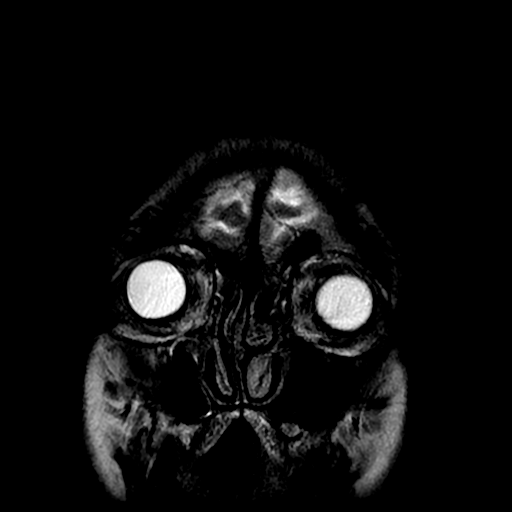
[im 14/27]
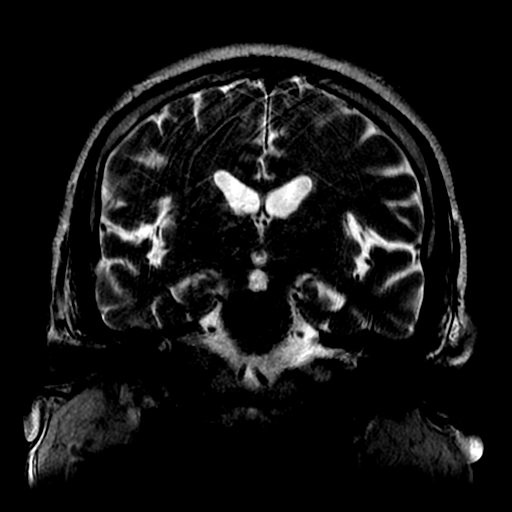
[im 27/27]
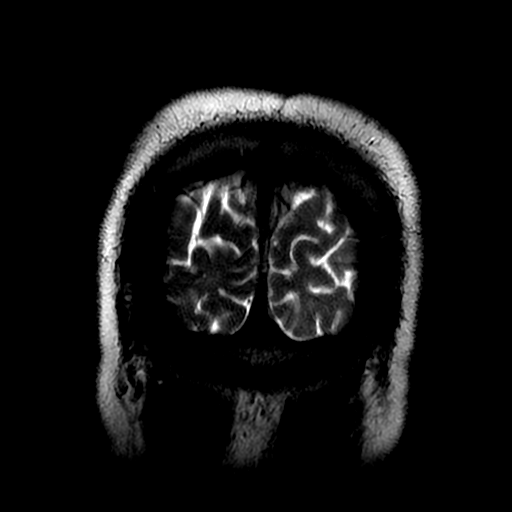

[Series 350: ADC · axial · 3.0mm · 0.94mm/px · z∈[-136,+11]mm · 5 of 50 slices shown (1 of 2)]
[im 1/50]
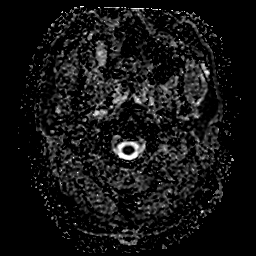
[im 13/50]
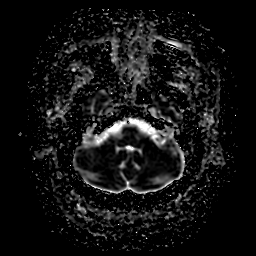
[im 25/50]
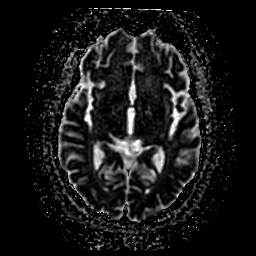
[im 37/50]
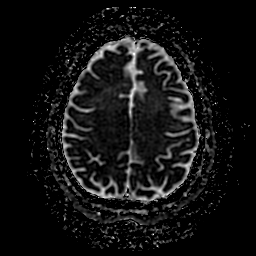
[im 50/50]
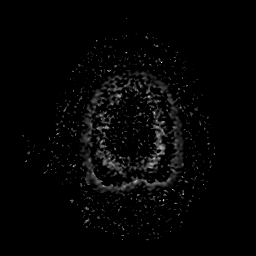

[Series 450: ADC · coronal · 4.0mm · 0.94mm/px · 4 of 39 slices shown (2 of 2)]
[im 1/39]
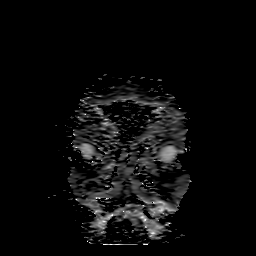
[im 13/39]
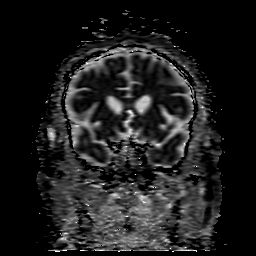
[im 26/39]
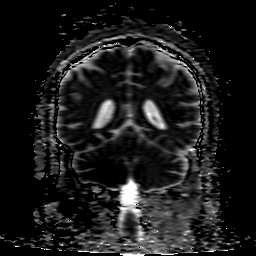
[im 39/39]
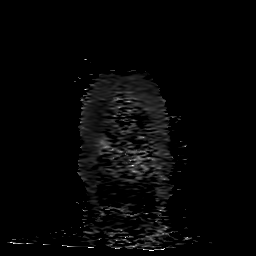

[36 of 48 positions shown; findings below may reference images not displayed]

FINDINGS: Brain: No acute infarction, hemorrhage, hydrocephalus, extra-axial
collection or mass lesion. Mild periventricular FLAIR hyperintensity
is nonspecific but usually chronic small vessel ischemia. Probable
lacune at the left caudate nucleus.

Vascular: Major flow voids are preserved

Skull and upper cervical spine: No evidence of marrow lesion

Sinuses/Orbits: Remote blowout fracture of the medial wall left
orbit. Minor mucosal thickening in the paranasal sinuses.

Other: Motion degraded.
IMPRESSION: 1. No acute finding including infarct.
2. Mild chronic small vessel ischemia.
3. Motion degraded.
